# Patient Record
Sex: Female | Born: 1970
Health system: Southern US, Community
[De-identification: ages and names within clinical notes are randomized; demographics above are authoritative.]

## PROBLEM LIST (undated history)

## (undated) DIAGNOSIS — J302 Other seasonal allergic rhinitis: Secondary | ICD-10-CM

## (undated) DIAGNOSIS — D0510 Intraductal carcinoma in situ of unspecified breast: Secondary | ICD-10-CM

## (undated) DIAGNOSIS — E079 Disorder of thyroid, unspecified: Secondary | ICD-10-CM

## (undated) DIAGNOSIS — L723 Sebaceous cyst: Secondary | ICD-10-CM

## (undated) DIAGNOSIS — E039 Hypothyroidism, unspecified: Secondary | ICD-10-CM

## (undated) DIAGNOSIS — N6320 Unspecified lump in the left breast, unspecified quadrant: Secondary | ICD-10-CM

## (undated) DIAGNOSIS — T7840XA Allergy, unspecified, initial encounter: Secondary | ICD-10-CM

## (undated) HISTORY — PX: BREAST BIOPSY: SHX20

## (undated) HISTORY — DX: Allergy, unspecified, initial encounter: T78.40XA

## (undated) HISTORY — PX: WISDOM TOOTH EXTRACTION: SHX21

## (undated) HISTORY — DX: Intraductal carcinoma in situ of unspecified breast: D05.10

## (undated) HISTORY — PX: BREAST EXCISIONAL BIOPSY: SUR124

---

## 1998-03-11 ENCOUNTER — Ambulatory Visit (HOSPITAL_COMMUNITY): Admission: RE | Admit: 1998-03-11 | Discharge: 1998-03-11 | Payer: Self-pay | Admitting: Internal Medicine

## 1998-03-12 ENCOUNTER — Encounter: Payer: Self-pay | Admitting: Internal Medicine

## 2000-07-18 ENCOUNTER — Ambulatory Visit (HOSPITAL_COMMUNITY): Admission: RE | Admit: 2000-07-18 | Discharge: 2000-07-18 | Payer: Self-pay | Admitting: Internal Medicine

## 2000-09-07 ENCOUNTER — Other Ambulatory Visit: Admission: RE | Admit: 2000-09-07 | Discharge: 2000-09-07 | Payer: Self-pay | Admitting: *Deleted

## 2001-10-24 ENCOUNTER — Emergency Department (HOSPITAL_COMMUNITY): Admission: EM | Admit: 2001-10-24 | Discharge: 2001-10-24 | Payer: Self-pay | Admitting: Emergency Medicine

## 2001-10-24 ENCOUNTER — Other Ambulatory Visit: Admission: RE | Admit: 2001-10-24 | Discharge: 2001-10-24 | Payer: Self-pay | Admitting: Obstetrics and Gynecology

## 2002-03-25 ENCOUNTER — Other Ambulatory Visit: Admission: RE | Admit: 2002-03-25 | Discharge: 2002-03-25 | Payer: Self-pay | Admitting: Obstetrics and Gynecology

## 2002-04-04 ENCOUNTER — Emergency Department (HOSPITAL_COMMUNITY): Admission: EM | Admit: 2002-04-04 | Discharge: 2002-04-04 | Payer: Self-pay | Admitting: Emergency Medicine

## 2002-06-17 ENCOUNTER — Other Ambulatory Visit: Admission: RE | Admit: 2002-06-17 | Discharge: 2002-06-17 | Payer: Self-pay | Admitting: Obstetrics and Gynecology

## 2002-10-30 ENCOUNTER — Other Ambulatory Visit: Admission: RE | Admit: 2002-10-30 | Discharge: 2002-10-30 | Payer: Self-pay | Admitting: Obstetrics and Gynecology

## 2003-01-30 ENCOUNTER — Other Ambulatory Visit: Admission: RE | Admit: 2003-01-30 | Discharge: 2003-01-30 | Payer: Self-pay | Admitting: Obstetrics and Gynecology

## 2003-11-03 ENCOUNTER — Other Ambulatory Visit: Admission: RE | Admit: 2003-11-03 | Discharge: 2003-11-03 | Payer: Self-pay | Admitting: Obstetrics and Gynecology

## 2004-07-07 ENCOUNTER — Inpatient Hospital Stay (HOSPITAL_COMMUNITY): Admission: AD | Admit: 2004-07-07 | Discharge: 2004-07-09 | Payer: Self-pay | Admitting: Obstetrics and Gynecology

## 2004-08-18 ENCOUNTER — Other Ambulatory Visit: Admission: RE | Admit: 2004-08-18 | Discharge: 2004-08-18 | Payer: Self-pay | Admitting: Obstetrics and Gynecology

## 2005-08-19 ENCOUNTER — Other Ambulatory Visit: Admission: RE | Admit: 2005-08-19 | Discharge: 2005-08-19 | Payer: Self-pay | Admitting: Obstetrics and Gynecology

## 2007-03-12 ENCOUNTER — Inpatient Hospital Stay (HOSPITAL_COMMUNITY): Admission: RE | Admit: 2007-03-12 | Discharge: 2007-03-14 | Payer: Self-pay | Admitting: Obstetrics and Gynecology

## 2008-06-20 ENCOUNTER — Emergency Department (HOSPITAL_BASED_OUTPATIENT_CLINIC_OR_DEPARTMENT_OTHER): Admission: EM | Admit: 2008-06-20 | Discharge: 2008-06-20 | Payer: Self-pay | Admitting: Emergency Medicine

## 2008-06-27 ENCOUNTER — Emergency Department (HOSPITAL_COMMUNITY): Admission: EM | Admit: 2008-06-27 | Discharge: 2008-06-27 | Payer: Self-pay | Admitting: Family Medicine

## 2010-06-04 ENCOUNTER — Other Ambulatory Visit: Payer: Self-pay | Admitting: Obstetrics and Gynecology

## 2010-06-04 DIAGNOSIS — R928 Other abnormal and inconclusive findings on diagnostic imaging of breast: Secondary | ICD-10-CM

## 2010-06-10 ENCOUNTER — Ambulatory Visit
Admission: RE | Admit: 2010-06-10 | Discharge: 2010-06-10 | Disposition: A | Discharge status: Home / Self Care | Payer: BC Managed Care – PPO | Source: Ambulatory Visit | Attending: Obstetrics and Gynecology | Admitting: Obstetrics and Gynecology

## 2010-06-10 DIAGNOSIS — R928 Other abnormal and inconclusive findings on diagnostic imaging of breast: Secondary | ICD-10-CM

## 2010-10-22 LAB — CBC
HCT: 35.3 — ABNORMAL LOW
Hemoglobin: 12.2
MCHC: 34.7
MCV: 93.9
MCV: 95.1
Platelets: 199
Platelets: 211
RBC: 3.76 — ABNORMAL LOW
RDW: 13.5
WBC: 13.7 — ABNORMAL HIGH
WBC: 18.8 — ABNORMAL HIGH

## 2010-10-22 LAB — CCBB MATERNAL DONOR DRAW

## 2010-10-22 LAB — RPR: RPR Ser Ql: NONREACTIVE

## 2012-05-09 ENCOUNTER — Emergency Department
Admission: EM | Admit: 2012-05-09 | Discharge: 2012-05-09 | Disposition: A | Discharge status: Home / Self Care | Payer: BC Managed Care – PPO | Source: Home / Self Care | Attending: Family Medicine | Admitting: Family Medicine

## 2012-05-09 ENCOUNTER — Encounter: Payer: Self-pay | Admitting: Emergency Medicine

## 2012-05-09 DIAGNOSIS — S139XXA Sprain of joints and ligaments of unspecified parts of neck, initial encounter: Secondary | ICD-10-CM

## 2012-05-09 DIAGNOSIS — S161XXA Strain of muscle, fascia and tendon at neck level, initial encounter: Secondary | ICD-10-CM

## 2012-05-09 HISTORY — DX: Disorder of thyroid, unspecified: E07.9

## 2012-05-09 MED ORDER — MELOXICAM 15 MG PO TABS: 15.0000 mg | ORAL_TABLET | Freq: Every day | ORAL | Status: DC

## 2012-05-09 MED ORDER — KETOROLAC TROMETHAMINE 30 MG/ML IJ SOLN
30.0000 mg | Freq: Once | INTRAMUSCULAR | Status: AC
  Administered 2012-05-09: 30 mg via INTRAMUSCULAR

## 2012-05-09 MED ORDER — CYCLOBENZAPRINE HCL 5 MG PO TABS: 5.0000 mg | ORAL_TABLET | Freq: Three times a day (TID) | ORAL | Status: DC | PRN

## 2012-05-09 NOTE — ED Notes (Signed)
Left shoulder pain while sitting at her desk today, denies trauma, denies workers comp

## 2012-05-09 NOTE — ED Provider Notes (Signed)
History     CSN: [id number]  Arrival date & time 05/09/12  4098   First MD Initiated Contact with Patient 05/09/12 [id number]      Chief Complaint  Patient presents with  . Shoulder Pain   HPI  Neck pain x1 day. Patient was at work when she turned her neck and developed sudden onset of left-sided neck pain. Has had difficulty turning This point. No radicular symptoms. Pain has progressed from the neck into the shoulder. This is never happened before. Patient denies any recent trauma. Past Medical History  Diagnosis Date  . Thyroid disease     History reviewed. No pertinent past surgical history.  No family history on file.  History  Substance Use Topics  . Smoking status: Never Smoker   . Smokeless tobacco: Not on file  . Alcohol Use: No    OB History   Grav Para Term Preterm Abortions TAB SAB Ect Mult Living                  Review of Systems  All other systems reviewed and are negative.    Allergies  Erythromycin  Home Medications   Current Outpatient Rx  Name  Route  Sig  Dispense  Refill  . cetirizine (ZYRTEC) 10 MG tablet   Oral   Take 10 mg by mouth daily.         Marland Kitchen levothyroxine (SYNTHROID, LEVOTHROID) 100 MCG tablet   Oral   Take 100 mcg by mouth daily before breakfast.         . cyclobenzaprine (FLEXERIL) 5 MG tablet   Oral   Take 1 tablet (5 mg total) by mouth 3 (three) times daily as needed for muscle spasms.   30 tablet   0   . meloxicam (MOBIC) 15 MG tablet   Oral   Take 1 tablet (15 mg total) by mouth daily.   30 tablet   1     BP 116/76  Pulse 95  Temp(Src) 98 F (36.7 C) (Oral)  Resp 16  Ht 5\' 6"  (1.676 m)  Wt 139 lb (63.05 kg)  BMI 22.45 kg/m2  SpO2 100%  LMP 05/06/2012  Physical Exam  Constitutional: She appears well-developed and well-nourished.  HENT:  Head: Normocephalic and atraumatic.    Eyes: Conjunctivae are normal. Pupils are equal, round, and reactive to light.  Neck:  Decreased neck range of motion  secondary to pain. Spurling's negative bilaterally.    ED Course  Procedures (including critical care time)  Labs Reviewed - No data to display No results found.   1. Cervical strain, initial encounter       MDM  Toradol 30 mg IM x1. Rice and NSAIDs. Will place on course of Mobic and Flexeril to help with pain. Discussed general care and muscular skeletal red flags. Handout given.  Follow up as needed.      The patient and/or caregiver has been counseled thoroughly with regard to treatment plan and/or medications prescribed including dosage, schedule, interactions, rationale for use, and possible side effects and they verbalize understanding. Diagnoses and expected course of recovery discussed and will return if not improved as expected or if the condition worsens. Patient and/or caregiver verbalized understanding.             Doree Albee, MD 05/09/12 1004

## 2012-07-07 DIAGNOSIS — E039 Hypothyroidism, unspecified: Secondary | ICD-10-CM | POA: Insufficient documentation

## 2012-07-07 DIAGNOSIS — E890 Postprocedural hypothyroidism: Secondary | ICD-10-CM | POA: Insufficient documentation

## 2013-06-17 ENCOUNTER — Other Ambulatory Visit: Payer: Self-pay | Admitting: Obstetrics and Gynecology

## 2013-06-17 DIAGNOSIS — R928 Other abnormal and inconclusive findings on diagnostic imaging of breast: Secondary | ICD-10-CM

## 2013-06-28 ENCOUNTER — Ambulatory Visit
Admission: RE | Admit: 2013-06-28 | Discharge: 2013-06-28 | Disposition: A | Discharge status: Home / Self Care | Payer: 59 | Source: Ambulatory Visit | Attending: Obstetrics and Gynecology | Admitting: Obstetrics and Gynecology

## 2013-06-28 DIAGNOSIS — R928 Other abnormal and inconclusive findings on diagnostic imaging of breast: Secondary | ICD-10-CM

## 2015-03-20 ENCOUNTER — Other Ambulatory Visit: Payer: Self-pay | Admitting: Surgery

## 2016-05-26 ENCOUNTER — Ambulatory Visit (INDEPENDENT_AMBULATORY_CARE_PROVIDER_SITE_OTHER): Payer: PRIVATE HEALTH INSURANCE | Admitting: Osteopathic Medicine

## 2016-05-26 ENCOUNTER — Encounter: Payer: Self-pay | Admitting: Osteopathic Medicine

## 2016-05-26 VITALS — BP 125/78 | HR 94 | Ht 65.0 in | Wt 132.0 lb

## 2016-05-26 DIAGNOSIS — Q159 Congenital malformation of eye, unspecified: Secondary | ICD-10-CM

## 2016-05-26 DIAGNOSIS — Z0000 Encounter for general adult medical examination without abnormal findings: Secondary | ICD-10-CM | POA: Diagnosis not present

## 2016-05-26 DIAGNOSIS — Z23 Encounter for immunization: Secondary | ICD-10-CM | POA: Diagnosis not present

## 2016-05-26 NOTE — Progress Notes (Signed)
HPI: Sheri [name] is a 46 y.o. female  who presents to [place] today, 05/26/16,  for chief complaint of:  Chief Complaint  Patient presents with  . Establish Care    EYE COLOR CONCERN    Very pleasant new patient here to establish care.  Only real concern is I color abdomen mildly: Whitish ring around the outside of the iris. No vision changes. Her eye doctor asked her to inquire regarding high cholesterol. She has no history of cholesterol issues.    Past medical, surgical, social and family history reviewed: There are no active problems to display for this patient.  History reviewed. No pertinent surgical history. Social History  Substance Use Topics  . Smoking status: Never Smoker  . Smokeless tobacco: Never Used  . Alcohol use No   History reviewed. No pertinent family history.   Current medication list and allergy/intolerance information reviewed:   Current Outpatient Prescriptions  Medication Sig Dispense Refill  . cetirizine (ZYRTEC) 10 MG tablet Take 10 mg by mouth daily.    Marland Kitchen levothyroxine (SYNTHROID, LEVOTHROID) 100 MCG tablet Take 100 mcg by mouth daily before breakfast.     No current facility-administered medications for this visit.    Allergies  Allergen Reactions  . Erythromycin       Review of Systems:  Constitutional:  No  fever, no chills, No recent illness, No unintentional weight changes. No significant fatigue.   HEENT: No  headache, no vision change, no hearing change, No sore throat, No  sinus pressure  Cardiac: No  chest pain, No  pressure, No palpitations, No  Orthopnea  Respiratory:  No  shortness of breath. No  Cough  Gastrointestinal: No  abdominal pain, No  nausea, No  vomiting,  No  blood in stool, No  diarrhea, No  constipation   Musculoskeletal: No new myalgia/arthralgia  Genitourinary: No  incontinence, No  abnormal genital bleeding, No abnormal genital discharge  Skin: No  Rash, No  other wounds/concerning lesions  Hem/Onc: No  easy bruising/bleeding, No  abnormal lymph node  Endocrine: No cold intolerance,  No heat intolerance. No polyuria/polydipsia/polyphagia   Neurologic: No  weakness, No  dizziness,  Psychiatric: No  concerns with depression, No  concerns with anxiety, No sleep problems, No mood problems  Exam:  BP 125/78   Pulse 94   Ht 5\' 5"  (1.651 m)   Wt 132 lb (59.9 kg)   BMI 21.97 kg/m   Constitutional: VS see above. General Appearance: alert, well-developed, well-nourished, NAD  Eyes: Normal lids and conjunctive, non-icteric sclera. Opaque than white line bordering outside of the iris.  Ears, Nose, Mouth, Throat: MMM, Normal external inspection ears/nares/mouth/lips/gums. TM normal bilaterally. Pharynx/tonsils no erythema, no exudate. Nasal mucosa normal.   Neck: No masses, trachea midline. No thyroid enlargement. No tenderness/mass appreciated. No lymphadenopathy  Respiratory: Normal respiratory effort. no wheeze, no rhonchi, no rales  Cardiovascular: S1/S2 normal, no murmur, no rub/gallop auscultated. RRR. No lower extremity edema. Pedal pulse II/IV bilaterally DP and PT. No carotid bruit or JVD. No abdominal aortic bruit.  Gastrointestinal: Nontender, no masses. No hepatomegaly, no splenomegaly. No hernia appreciated. Bowel sounds normal. Rectal exam deferred.   Musculoskeletal: Gait normal. No clubbing/cyanosis of digits.   Neurological: Normal balance/coordination. No tremor. No cranial nerve deficit on limited exam. Motor and sensation intact and symmetric. Cerebellar reflexes intact.   Skin: warm, dry, intact. No rash/ulcer. No concerning nevi or subq nodules on limited exam.    Psychiatric:  Normal judgment/insight. Normal mood and affect. Oriented x3.      ASSESSMENT/PLAN:   Annual physical exam - Plan: CBC with Differential/Platelet, COMPLETE METABOLIC PANEL WITH GFR, Lipid panel, VITAMIN D 25 Hydroxy (Vit-D Deficiency,  Fractures)  Eye abnormality - Seems a bit young for arcus senilis but possible limbus sign? Await calcium levels though apparently these can be normal.  Need for tetanus, diphtheria, and acellular pertussis (Tdap) vaccine in patient of adolescent age or older - Plan: Tdap vaccine greater than or equal to 7yo IM   FEMALE PREVENTIVE CARE Updated 05/26/16   ANNUAL SCREENING/COUNSELING  Diet/Exercise - HEALTHY HABITS DISCUSSED TO DECREASE CV RISK History  Smoking Status  . Never Smoker  Smokeless Tobacco  . Never Used   History  Alcohol Use No   No flowsheet data found.  Domestic violence concerns - no  HTN SCREENING - SEE [place]  Sexually active in the past year - Yes with female.  Need/want STI testing today? - no  Concerns about libido or pain with sex? - no  Plans for pregnancy? - husband has vasectomy   INFECTIOUS DISEASE SCREENING  HIV - does not need  GC/CT - does not need  HepC - DOB [birth year] - does not need  TB - does not need  DISEASE SCREENING  Lipid - needs - usually gets through husband's employer   DM2 - needs -   Osteoporosis - women age 63+ - does not need  CANCER SCREENING  Cervical - does not need  Breast - does not need  Lung - does not need  Colon - does not need  ADULT VACCINATION  Influenza - annual vaccine recommended  Td - booster every 10 years - needs booster    Zoster - option at 50, yes at 60+   PCV13 - was not indicated  PPSV23 - was not indicated Immunization History  Administered Date(s) Administered  . Influenza Split 03/20/2012  . Influenza,inj,Quad PF,36+ Mos 10/31/2012  . Td 07/02/2002  . Tdap 05/26/2016     Patient Instructions  Plan: 1. Await lab results and records form your eye doctor for further characterization of the rings in the eye - I don't suspect anything dangerous but let's be sure!  2. Please return to get fasting labs done at your convenience  3. Any questions or  concerns, please let us know! 4. If you don't hear back about your lab results within one week of your blood draw, please call us!     Visit summary with medication list and pertinent instructions was printed for patient to review. All questions at time of visit were answered - patient instructed to contact office with any additional concerns. ER/RTC precautions were reviewed with the patient. Follow-up plan: Return in about 1 year (around 05/26/2017) for [place], sooner if needed / based on lab results.

## 2016-05-26 NOTE — Patient Instructions (Signed)
Plan: 1. Await lab results and records form your eye doctor for further characterization of the rings in the eye - I don't suspect anything dangerous but let's be sure!  2. Please return to get fasting labs done at your convenience  3. Any questions or concerns, please let us know! 4. If you don't hear back about your lab results within one week of your blood draw, please call us!

## 2016-05-27 DIAGNOSIS — Q159 Congenital malformation of eye, unspecified: Secondary | ICD-10-CM | POA: Insufficient documentation

## 2016-05-27 DIAGNOSIS — Z0000 Encounter for general adult medical examination without abnormal findings: Secondary | ICD-10-CM | POA: Insufficient documentation

## 2016-05-27 HISTORY — DX: Congenital malformation of eye, unspecified: Q15.9

## 2016-05-27 LAB — CBC WITH DIFFERENTIAL/PLATELET
BASOS ABS: 0 {cells}/uL (ref 0–200)
Basophils Relative: 0 %
Eosinophils Absolute: 44 cells/uL (ref 15–500)
Eosinophils Relative: 1 %
HEMATOCRIT: 43.5 % (ref 35.0–45.0)
Hemoglobin: 14.8 g/dL (ref 11.7–15.5)
LYMPHS PCT: 36 %
Lymphs Abs: 1584 cells/uL (ref 850–3900)
MCH: 30.1 pg (ref 27.0–33.0)
MCHC: 34 g/dL (ref 32.0–36.0)
MCV: 88.6 fL (ref 80.0–100.0)
MONO ABS: 264 {cells}/uL (ref 200–950)
MONOS PCT: 6 %
MPV: 11.4 fL (ref 7.5–12.5)
Neutro Abs: 2508 cells/uL (ref 1500–7800)
Neutrophils Relative %: 57 %
PLATELETS: 231 10*3/uL (ref 140–400)
RBC: 4.91 MIL/uL (ref 3.80–5.10)
RDW: 13.2 % (ref 11.0–15.0)
WBC: 4.4 10*3/uL (ref 3.8–10.8)

## 2016-05-27 LAB — COMPLETE METABOLIC PANEL WITH GFR
AG RATIO: 1.7 ratio (ref 1.0–2.5)
ALK PHOS: 65 U/L (ref 33–115)
ALT: 24 U/L (ref 6–29)
AST: 25 U/L (ref 10–35)
Albumin: 4.4 g/dL (ref 3.6–5.1)
BILIRUBIN TOTAL: 0.7 mg/dL (ref 0.2–1.2)
BUN/Creatinine Ratio: 14.5 Ratio (ref 6–22)
BUN: 11 mg/dL (ref 7–25)
CALCIUM: 9.2 mg/dL (ref 8.6–10.2)
CO2: 19 mmol/L — AB (ref 20–31)
CREATININE: 0.76 mg/dL (ref 0.50–1.10)
Chloride: 105 mmol/L (ref 98–110)
GFR, Est Non African American: >89 mL/min (ref >=60)
GLOBULIN: 2.6 g/dL (ref 1.9–3.7)
GLUCOSE: 90 mg/dL (ref 65–99)
Potassium: 4 mmol/L (ref 3.5–5.3)
Sodium: 140 mmol/L (ref 135–146)
TOTAL PROTEIN: 7 g/dL (ref 6.1–8.1)

## 2016-05-27 LAB — LIPID PANEL
Cholesterol: 185 mg/dL (ref <200)
HDL: 112 mg/dL (ref >50)
LDL CALC: 61 mg/dL (ref <100)
Total CHOL/HDL Ratio: 1.7 Ratio (ref <5.0)
Triglycerides: 60 mg/dL (ref <150)
VLDL: 12 mg/dL (ref <30)

## 2016-05-28 LAB — VITAMIN D 25 HYDROXY (VIT D DEFICIENCY, FRACTURES): VIT D 25 HYDROXY: 22 ng/mL — AB (ref 30–100)

## 2016-10-10 ENCOUNTER — Ambulatory Visit (INDEPENDENT_AMBULATORY_CARE_PROVIDER_SITE_OTHER): Payer: PRIVATE HEALTH INSURANCE | Admitting: Osteopathic Medicine

## 2016-10-10 ENCOUNTER — Encounter: Payer: Self-pay | Admitting: Osteopathic Medicine

## 2016-10-10 VITALS — BP 110/59 | HR 91 | Ht 65.5 in | Wt 128.0 lb

## 2016-10-10 DIAGNOSIS — R7301 Impaired fasting glucose: Secondary | ICD-10-CM

## 2016-10-10 LAB — POCT GLYCOSYLATED HEMOGLOBIN (HGB A1C): Hemoglobin A1C: 5.4

## 2016-10-10 NOTE — Progress Notes (Signed)
HPI: Sheri [name] is a 46 y.o. female  who presents to [facility] today, 10/10/16,  for chief complaint of:  Chief Complaint  Patient presents with  . Follow-up    discuss lab results    Labs at other provider showed fasting Glc of 105. This is new high for her, DM2 runs in the family, she is concerned she might be affected. A1C 5.4% today   Hypothyroid: meds were adjusted since last I saw her, she is doing well on 88 mcg.    Results for orders placed or performed in visit on 10/10/16 (from the past 24 hour(s))  POCT HgB A1C     Status: None   Collection Time: 10/10/16  1:24 PM  Result Value Ref Range   Hemoglobin A1C 5.4      Past medical, surgical, social and family history reviewed: Patient Active Problem List   Diagnosis Date Noted  . Eye abnormality 05/27/2016  . Annual physical exam 05/27/2016   No past surgical history on file. Social History  Substance Use Topics  . Smoking status: Never Smoker  . Smokeless tobacco: Never Used  . Alcohol use No   Family History  Problem Relation Age of Onset  . Diabetes Mother   . Hypertension Father   . COPD Father      Current medication list and allergy/intolerance information reviewed:   Current Outpatient Prescriptions  Medication Sig Dispense Refill  . cetirizine (ZYRTEC) 10 MG tablet Take 10 mg by mouth daily.    Marland Kitchen levothyroxine (SYNTHROID, LEVOTHROID) 100 MCG tablet Take 100 mcg by mouth daily before breakfast.     No current facility-administered medications for this visit.    Allergies  Allergen Reactions  . Erythromycin   . Shellfish-Derived Products       Review of Systems:  Constitutional:  No  fever, no chills, No recent illness.   Cardiac: No  chest pain  Respiratory:  No  shortness of breath.  Endocrine: No cold intolerance,  No heat intolerance. No polyuria/polydipsia/polyphagia   Exam:  BP (!) 110/59   Pulse 91   Ht 5' 5.5" (1.664 m)   Wt 128 lb  (58.1 kg)   BMI 20.98 kg/m   Constitutional: VS see above. General Appearance: alert, well-developed, well-nourished, NAD  Neck: No masses, trachea midline.   Respiratory: Normal respiratory effort.   Musculoskeletal: Gait normal.  Neurological: Normal balance/coordination. No tremor.   Skin: warm, dry, intact.   Psychiatric: Normal judgment/insight. Normal mood and affect. Oriented x3.     ASSESSMENT/PLAN:   Elevated fasting glucose - No DM2 or Pre-DM, pt reassured - Plan: POCT HgB A1C      Visit summary with medication list and pertinent instructions was printed for patient to review. All questions at time of visit were answered - patient instructed to contact office with any additional concerns. ER/RTC precautions were reviewed with the patient. Follow-up plan: Return for ANNUAL PHYSICAL 05/2016 or see me sooner if needed .  Note: Total time spent 15 minutes, greater than 50% of the visit was spent face-to-face counseling and coordinating care for the following: The encounter diagnosis was Elevated fasting glucose.Marland Kitchen

## 2016-10-10 NOTE — Patient Instructions (Signed)

## 2016-12-27 ENCOUNTER — Encounter: Payer: Self-pay | Admitting: Osteopathic Medicine

## 2016-12-27 ENCOUNTER — Ambulatory Visit (INDEPENDENT_AMBULATORY_CARE_PROVIDER_SITE_OTHER): Payer: PRIVATE HEALTH INSURANCE | Admitting: Osteopathic Medicine

## 2016-12-27 VITALS — BP 127/76 | HR 87 | Temp 98.8°F | Resp 16 | Wt 127.5 lb

## 2016-12-27 DIAGNOSIS — E039 Hypothyroidism, unspecified: Secondary | ICD-10-CM | POA: Diagnosis not present

## 2016-12-27 DIAGNOSIS — R42 Dizziness and giddiness: Secondary | ICD-10-CM

## 2016-12-27 MED ORDER — MECLIZINE HCL 25 MG PO TABS: 25.0000 mg | ORAL_TABLET | Freq: Three times a day (TID) | ORAL | 0 refills | Status: DC | PRN

## 2016-12-27 NOTE — Progress Notes (Signed)
HPI: Sheri [name] is a 46 y.o. female who  has a past medical history of Thyroid disease.  she presents to [facility] today, 12/27/16,  for chief complaint of:  Chief Complaint  Patient presents with  . Dizziness    off balance; dizziness x 1 week  . Nausea    x today    Nausea new today. Off balance/dizziness for about a week. Not sure if inner ear or vertigo. Has had similar episodes in the past which have resolved on their own. She does not note orthostatic symptoms, she does note some symptoms with turning head too quickly.     Past medical, surgical, social and family history reviewed:  Patient Active Problem List   Diagnosis Date Noted  . Eye abnormality 05/27/2016  . Annual physical exam 05/27/2016  . Hypothyroidism 07/07/2012    No past surgical history on file.  Social History   Tobacco Use  . Smoking status: Never Smoker  . Smokeless tobacco: Never Used  Substance Use Topics  . Alcohol use: No    Family History  Problem Relation Age of Onset  . Diabetes Mother   . Hypertension Father   . COPD Father      Current medication list and allergy/intolerance information reviewed:    Current Outpatient Medications  Medication Sig Dispense Refill  . cetirizine (ZYRTEC) 10 MG tablet Take 10 mg by mouth daily.    . Multiple Vitamin (MULTIVITAMIN) tablet Take 1 tablet by mouth daily.    Marland Kitchen SYNTHROID 88 MCG tablet Take 88 mcg by mouth daily.  0   No current facility-administered medications for this visit.     Allergies  Allergen Reactions  . Erythromycin   . Shellfish-Derived Products       Review of Systems:  Constitutional:  No  fever, no chills, No recent illness.   HEENT: No  headache, no vision change, no hearing change, No sore throat, No  sinus pressure  Cardiac: No  chest pain, No  pressure, No palpitations  Respiratory:  No  shortness of breath. No  Cough  Gastrointestinal: No  abdominal pain, No   nausea, No  vomiting,  No  blood in stool, No  diarrhea, No  constipation   Musculoskeletal: No new myalgia/arthralgia  Skin: No  Rash  Neurologic: No  weakness, +dizziness, No  slurred speech/focal weakness/facial droop  Psychiatric: No  concerns with depression, No  concerns with anxiety  Exam:  BP 127/76 (BP Location: Right Arm, Patient Position: Sitting, Cuff Size: Large)   Pulse 87   Temp 98.8 F (37.1 C) (Oral)   Resp 16   Wt 127 lb 8 oz (57.8 kg)   LMP 12/08/2016 (Approximate)   SpO2 99%   BMI 20.89 kg/m   Constitutional: VS see above. General Appearance: alert, well-developed, well-nourished, NAD  Eyes: Normal lids and conjunctive, non-icteric sclera  Ears, Nose, Mouth, Throat: MMM, Normal external inspection ears/nares/mouth/lips/gums. TM normal bilaterally. Pharynx/tonsils no erythema, no exudate. Nasal mucosa normal.   Neck: No masses, trachea midline. No thyroid enlargement.   Respiratory: Normal respiratory effort. no wheeze, no rhonchi, no rales  Cardiovascular: S1/S2 normal, no murmur, no rub/gallop auscultated. RRR. No lower extremity edema.  Gastrointestinal: Nontender, no masses.  Musculoskeletal: Gait normal.  Neurological: Normal balance/coordination. No tremor. No cranial nerve deficit on limited exam. Motor and sensation intact and symmetric. Cerebellar reflexes intact. Dix-Hallpike maneuver reproduces symptoms to a mild degree bilaterally, no nystagmus.  Skin: warm, dry, intact.  No rash/ulcer.   Psychiatric: Normal judgment/insight. Normal mood and affect. Oriented x3.    Results for orders placed or performed in visit on 12/27/16 (from the past 72 hour(s))  CBC     Status: None   Collection Time: 12/27/16  4:15 PM  Result Value Ref Range   WBC 5.8 3.8 - 10.8 Thousand/uL   RBC 4.49 3.80 - 5.10 Million/uL   Hemoglobin 13.7 11.7 - 15.5 g/dL   HCT 39.7 35.0 - 45.0 %   MCV 88.4 80.0 - 100.0 fL   MCH 30.5 27.0 - 33.0 pg   MCHC 34.5 32.0 - 36.0  g/dL   RDW 12.0 11.0 - 15.0 %   Platelets 194 140 - 400 Thousand/uL   MPV 12.0 7.5 - 12.5 fL  COMPLETE METABOLIC PANEL WITH GFR     Status: None   Collection Time: 12/27/16  4:15 PM  Result Value Ref Range   Glucose, Bld 88 65 - 99 mg/dL    Comment: .            Fasting reference interval .    BUN 10 7 - 25 mg/dL   Creat 0.60 0.50 - 1.10 mg/dL   GFR, Est Non African American 110 > OR = 60 mL/min/1.61m2   GFR, Est African American 128 > OR = 60 mL/min/1.35m2   BUN/Creatinine Ratio NOT APPLICABLE 6 - 22 (calc)   Sodium 137 135 - 146 mmol/L   Potassium 3.6 3.5 - 5.3 mmol/L   Chloride 104 98 - 110 mmol/L   CO2 25 20 - 32 mmol/L   Calcium 9.3 8.6 - 10.2 mg/dL   Total Protein 6.8 6.1 - 8.1 g/dL   Albumin 4.3 3.6 - 5.1 g/dL   Globulin 2.5 1.9 - 3.7 g/dL (calc)   AG Ratio 1.7 1.0 - 2.5 (calc)   Total Bilirubin 0.6 0.2 - 1.2 mg/dL   Alkaline phosphatase (APISO) 60 33 - 115 U/L   AST 17 10 - 35 U/L   ALT 15 6 - 29 U/L  TSH     Status: None   Collection Time: 12/27/16  4:15 PM  Result Value Ref Range   TSH 0.80 mIU/L    Comment:           Reference Range .           > or = 20 Years  0.40-4.50 .                Pregnancy Ranges           First trimester    0.26-2.66           Second trimester   0.55-2.73           Third trimester    0.43-2.91     No results found.   ASSESSMENT/PLAN:   Given recurrent nature of symptoms, more likely BPPV, consider imaging/neurology referral if persist or worsen. Patient given printed information for modified Epley maneuvers to you at home and prescription for meclizine to take as needed  Vertigo - Plan: CBC, COMPLETE METABOLIC PANEL WITH GFR, TSH  Hypothyroidism, unspecified type    Patient Instructions  Vertigo Vertigo is the feeling that you or your surroundings are moving when they are not. Vertigo can be dangerous if it occurs while you are doing something that could endanger you or others, such as driving. What are the causes? This  condition is caused by a disturbance in the signals that are sent by your body's  sensory systems to your brain. Different causes of a disturbance can lead to vertigo, including:  Infections, especially in the inner ear.  A bad reaction to a drug, or misuse of alcohol and medicines.  Withdrawal from drugs or alcohol.  Quickly changing positions, as when lying down or rolling over in bed.  Migraine headaches.  Decreased blood flow to the brain.  Decreased blood pressure.  Increased pressure in the brain from a head or neck injury, stroke, infection, tumor, or bleeding.  Central nervous system disorders.  What are the signs or symptoms? Symptoms of this condition usually occur when you move your head or your eyes in different directions. Symptoms may start suddenly, and they usually last for less than a minute. Symptoms may include:  Loss of balance and falling.  Feeling like you are spinning or moving.  Feeling like your surroundings are spinning or moving.  Nausea and vomiting.  Blurred vision or double vision.  Difficulty hearing.  Slurred speech.  Dizziness.  Involuntary eye movement (nystagmus).  Symptoms can be mild and cause only slight annoyance, or they can be severe and interfere with daily life. Episodes of vertigo may return (recur) over time, and they are often triggered by certain movements. Symptoms may improve over time. How is this diagnosed? This condition may be diagnosed based on medical history and the quality of your nystagmus. Your health care provider may test your eye movements by asking you to quickly change positions to trigger the nystagmus. This may be called the Dix-Hallpike test, head thrust test, or roll test. You may be referred to a health care provider who specializes in ear, nose, and throat (ENT) problems (otolaryngologist) or a provider who specializes in disorders of the central nervous system (neurologist). You may have additional  testing, including:  A physical exam.  Blood tests.  MRI.  A CT scan.  An electrocardiogram (ECG). This records electrical activity in your heart.  An electroencephalogram (EEG). This records electrical activity in your brain.  Hearing tests.  How is this treated? Treatment for this condition depends on the cause and the severity of the symptoms. Treatment options include:  Medicines to treat nausea or vertigo. These are usually used for severe cases. Some medicines that are used to treat other conditions may also reduce or eliminate vertigo symptoms. These include: ? Medicines that control allergies (antihistamines). ? Medicines that control seizures (anticonvulsants). ? Medicines that relieve depression (antidepressants). ? Medicines that relieve anxiety (sedatives).  Head movements to adjust your inner ear back to normal. If your vertigo is caused by an ear problem, your health care provider may recommend certain movements to correct the problem.  Surgery. This is rare.  Follow these instructions at home: Safety  Move slowly.Avoid sudden body or head movements.  Avoid driving.  Avoid operating heavy machinery.  Avoid doing any tasks that would cause danger to you or others if you would have a vertigo episode during the task.  If you have trouble walking or keeping your balance, try using a cane for stability. If you feel dizzy or unstable, sit down right away.  Return to your normal activities as told by your health care provider. Ask your health care provider what activities are safe for you. General instructions  Take over-the-counter and prescription medicines only as told by your health care provider.  Avoid certain positions or movements as told by your health care provider.  Drink enough fluid to keep your urine clear or pale yellow.  Keep  all follow-up visits as told by your health care provider. This is important. Contact a health care provider  if:  Your medicines do not relieve your vertigo or they make it worse.  You have a fever.  Your condition gets worse or you develop new symptoms.  Your family or friends notice any behavioral changes.  Your nausea or vomiting gets worse.  You have numbness or a "pins and needles" sensation in part of your body. Get help right away if:  You have difficulty moving or speaking.  You are always dizzy.  You faint.  You develop severe headaches.  You have weakness in your hands, arms, or legs.  You have changes in your hearing or vision.  You develop a stiff neck.  You develop sensitivity to light. This information is not intended to replace advice given to you by your health care provider. Make sure you discuss any questions you have with your health care provider. Document Released: 10/27/2004 Document Revised: 07/01/2015 Document Reviewed: 05/12/2014 Elsevier Interactive Patient Education  2017 [employer].     Visit summary with medication list and pertinent instructions was printed for patient to review. All questions at time of visit were answered - patient instructed to contact office with any additional concerns. ER/RTC precautions were reviewed with the patient. Follow-up plan: Return if symptoms worsen see me ASAP, or let me know if you fail to improve in 1-2 weeks .  Note: Total time spent 25 minutes, greater than 50% of the visit was spent face-to-face counseling and coordinating care for the following: The primary encounter diagnosis was Vertigo. A diagnosis of Hypothyroidism, unspecified type was also pertinent to this visit.Marland Kitchen  Please note: voice recognition software was used to produce this document, and typos may escape review. Please contact Dr. Sheppard Coil for any needed clarifications.

## 2016-12-27 NOTE — Patient Instructions (Signed)
Vertigo Vertigo is the feeling that you or your surroundings are moving when they are not. Vertigo can be dangerous if it occurs while you are doing something that could endanger you or others, such as driving. What are the causes? This condition is caused by a disturbance in the signals that are sent by your body's sensory systems to your brain. Different causes of a disturbance can lead to vertigo, including:  Infections, especially in the inner ear.  A bad reaction to a drug, or misuse of alcohol and medicines.  Withdrawal from drugs or alcohol.  Quickly changing positions, as when lying down or rolling over in bed.  Migraine headaches.  Decreased blood flow to the brain.  Decreased blood pressure.  Increased pressure in the brain from a head or neck injury, stroke, infection, tumor, or bleeding.  Central nervous system disorders.  What are the signs or symptoms? Symptoms of this condition usually occur when you move your head or your eyes in different directions. Symptoms may start suddenly, and they usually last for less than a minute. Symptoms may include:  Loss of balance and falling.  Feeling like you are spinning or moving.  Feeling like your surroundings are spinning or moving.  Nausea and vomiting.  Blurred vision or double vision.  Difficulty hearing.  Slurred speech.  Dizziness.  Involuntary eye movement (nystagmus).  Symptoms can be mild and cause only slight annoyance, or they can be severe and interfere with daily life. Episodes of vertigo may return (recur) over time, and they are often triggered by certain movements. Symptoms may improve over time. How is this diagnosed? This condition may be diagnosed based on medical history and the quality of your nystagmus. Your health care provider may test your eye movements by asking you to quickly change positions to trigger the nystagmus. This may be called the Dix-Hallpike test, head thrust test, or roll test.  You may be referred to a health care provider who specializes in ear, nose, and throat (ENT) problems (otolaryngologist) or a provider who specializes in disorders of the central nervous system (neurologist). You may have additional testing, including:  A physical exam.  Blood tests.  MRI.  A CT scan.  An electrocardiogram (ECG). This records electrical activity in your heart.  An electroencephalogram (EEG). This records electrical activity in your brain.  Hearing tests.  How is this treated? Treatment for this condition depends on the cause and the severity of the symptoms. Treatment options include:  Medicines to treat nausea or vertigo. These are usually used for severe cases. Some medicines that are used to treat other conditions may also reduce or eliminate vertigo symptoms. These include: ? Medicines that control allergies (antihistamines). ? Medicines that control seizures (anticonvulsants). ? Medicines that relieve depression (antidepressants). ? Medicines that relieve anxiety (sedatives).  Head movements to adjust your inner ear back to normal. If your vertigo is caused by an ear problem, your health care provider may recommend certain movements to correct the problem.  Surgery. This is rare.  Follow these instructions at home: Safety  Move slowly.Avoid sudden body or head movements.  Avoid driving.  Avoid operating heavy machinery.  Avoid doing any tasks that would cause danger to you or others if you would have a vertigo episode during the task.  If you have trouble walking or keeping your balance, try using a cane for stability. If you feel dizzy or unstable, sit down right away.  Return to your normal activities as told by your   health care provider. Ask your health care provider what activities are safe for you. General instructions  Take over-the-counter and prescription medicines only as told by your health care provider.  Avoid certain positions or  movements as told by your health care provider.  Drink enough fluid to keep your urine clear or pale yellow.  Keep all follow-up visits as told by your health care provider. This is important. Contact a health care provider if:  Your medicines do not relieve your vertigo or they make it worse.  You have a fever.  Your condition gets worse or you develop new symptoms.  Your family or friends notice any behavioral changes.  Your nausea or vomiting gets worse.  You have numbness or a "pins and needles" sensation in part of your body. Get help right away if:  You have difficulty moving or speaking.  You are always dizzy.  You faint.  You develop severe headaches.  You have weakness in your hands, arms, or legs.  You have changes in your hearing or vision.  You develop a stiff neck.  You develop sensitivity to light. This information is not intended to replace advice given to you by your health care provider. Make sure you discuss any questions you have with your health care provider. Document Released: 10/27/2004 Document Revised: 07/01/2015 Document Reviewed: 05/12/2014 Elsevier Interactive Patient Education  2017 [employer]  

## 2016-12-28 DIAGNOSIS — R42 Dizziness and giddiness: Secondary | ICD-10-CM | POA: Insufficient documentation

## 2016-12-28 LAB — COMPLETE METABOLIC PANEL WITH GFR
AG Ratio: 1.7 (calc) (ref 1.0–2.5)
ALBUMIN MSPROF: 4.3 g/dL (ref 3.6–5.1)
ALT: 15 U/L (ref 6–29)
AST: 17 U/L (ref 10–35)
Alkaline phosphatase (APISO): 60 U/L (ref 33–115)
BUN: 10 mg/dL (ref 7–25)
CALCIUM: 9.3 mg/dL (ref 8.6–10.2)
CO2: 25 mmol/L (ref 20–32)
CREATININE: 0.6 mg/dL (ref 0.50–1.10)
Chloride: 104 mmol/L (ref 98–110)
GFR, EST AFRICAN AMERICAN: 128 mL/min/{1.73_m2} (ref >=60)
GFR, EST NON AFRICAN AMERICAN: 110 mL/min/{1.73_m2} (ref >=60)
Globulin: 2.5 g/dL (calc) (ref 1.9–3.7)
Glucose, Bld: 88 mg/dL (ref 65–99)
Potassium: 3.6 mmol/L (ref 3.5–5.3)
Sodium: 137 mmol/L (ref 135–146)
TOTAL PROTEIN: 6.8 g/dL (ref 6.1–8.1)
Total Bilirubin: 0.6 mg/dL (ref 0.2–1.2)

## 2016-12-28 LAB — CBC
HCT: 39.7 % (ref 35.0–45.0)
HEMOGLOBIN: 13.7 g/dL (ref 11.7–15.5)
MCH: 30.5 pg (ref 27.0–33.0)
MCHC: 34.5 g/dL (ref 32.0–36.0)
MCV: 88.4 fL (ref 80.0–100.0)
MPV: 12 fL (ref 7.5–12.5)
Platelets: 194 10*3/uL (ref 140–400)
RBC: 4.49 10*6/uL (ref 3.80–5.10)
RDW: 12 % (ref 11.0–15.0)
WBC: 5.8 10*3/uL (ref 3.8–10.8)

## 2016-12-28 LAB — TSH: TSH: 0.8 m[IU]/L

## 2017-05-30 ENCOUNTER — Encounter: Payer: Self-pay | Admitting: Osteopathic Medicine

## 2017-05-30 ENCOUNTER — Ambulatory Visit (INDEPENDENT_AMBULATORY_CARE_PROVIDER_SITE_OTHER): Payer: PRIVATE HEALTH INSURANCE | Admitting: Osteopathic Medicine

## 2017-05-30 VITALS — BP 114/69 | HR 88 | Temp 98.2°F | Wt 124.9 lb

## 2017-05-30 DIAGNOSIS — J302 Other seasonal allergic rhinitis: Secondary | ICD-10-CM | POA: Diagnosis not present

## 2017-05-30 DIAGNOSIS — R7301 Impaired fasting glucose: Secondary | ICD-10-CM

## 2017-05-30 DIAGNOSIS — Z0000 Encounter for general adult medical examination without abnormal findings: Secondary | ICD-10-CM

## 2017-05-30 DIAGNOSIS — E039 Hypothyroidism, unspecified: Secondary | ICD-10-CM | POA: Diagnosis not present

## 2017-05-30 LAB — COMPLETE METABOLIC PANEL WITH GFR
AG Ratio: 1.8 (calc) (ref 1.0–2.5)
ALT: 24 U/L (ref 6–29)
AST: 25 U/L (ref 10–35)
Albumin: 4.3 g/dL (ref 3.6–5.1)
Alkaline phosphatase (APISO): 63 U/L (ref 33–115)
BUN: 17 mg/dL (ref 7–25)
CO2: 26 mmol/L (ref 20–32)
CREATININE: 0.66 mg/dL (ref 0.50–1.10)
Calcium: 9.3 mg/dL (ref 8.6–10.2)
Chloride: 107 mmol/L (ref 98–110)
GFR, Est African American: 123 mL/min/{1.73_m2} (ref >=60)
GFR, Est Non African American: 106 mL/min/{1.73_m2} (ref >=60)
Globulin: 2.4 g/dL (calc) (ref 1.9–3.7)
Glucose, Bld: 83 mg/dL (ref 65–99)
POTASSIUM: 4 mmol/L (ref 3.5–5.3)
Sodium: 142 mmol/L (ref 135–146)
Total Bilirubin: 0.5 mg/dL (ref 0.2–1.2)
Total Protein: 6.7 g/dL (ref 6.1–8.1)

## 2017-05-30 LAB — CBC
HCT: 40.4 % (ref 35.0–45.0)
Hemoglobin: 13.8 g/dL (ref 11.7–15.5)
MCH: 30.3 pg (ref 27.0–33.0)
MCHC: 34.2 g/dL (ref 32.0–36.0)
MCV: 88.6 fL (ref 80.0–100.0)
MPV: 11.7 fL (ref 7.5–12.5)
PLATELETS: 201 10*3/uL (ref 140–400)
RBC: 4.56 10*6/uL (ref 3.80–5.10)
RDW: 12.4 % (ref 11.0–15.0)
WBC: 3.7 10*3/uL — AB (ref 3.8–10.8)

## 2017-05-30 LAB — LIPID PANEL
CHOL/HDL RATIO: 2 (calc) (ref <5.0)
Cholesterol: 185 mg/dL (ref <200)
HDL: 92 mg/dL (ref >50)
LDL CHOLESTEROL (CALC): 79 mg/dL
NON-HDL CHOLESTEROL (CALC): 93 mg/dL (ref <130)
TRIGLYCERIDES: 49 mg/dL (ref <150)

## 2017-05-30 LAB — TSH: TSH: 0.39 mIU/L — ABNORMAL LOW

## 2017-05-30 MED ORDER — LEVOCETIRIZINE DIHYDROCHLORIDE 5 MG PO TABS: 5.0000 mg | ORAL_TABLET | Freq: Every evening | ORAL | 3 refills | Status: DC

## 2017-05-30 NOTE — Progress Notes (Signed)
HPI: Sheri [name] is a 47 y.o. female  who presents to [facility] today, 05/30/17,  for chief complaint of:  Annual check-up  Patient here for annual physical / wellness exam.  See preventive care reviewed as below.  Recent labs reviewed in detail with the patient.   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    Past medical, surgical, social and family history reviewed: Patient Active Problem List   Diagnosis Date Noted  . Vertigo 12/28/2016  . Eye abnormality 05/27/2016  . Annual physical exam 05/27/2016  . Hypothyroidism 07/07/2012   No past surgical history on file. Social History   Tobacco Use  . Smoking status: Never Smoker  . Smokeless tobacco: Never Used  Substance Use Topics  . Alcohol use: No   Family History  Problem Relation Age of Onset  . Diabetes Mother   . Hypertension Father   . COPD Father      Current medication list and allergy/intolerance information reviewed:   Current Outpatient Medications  Medication Sig Dispense Refill  . cetirizine (ZYRTEC) 10 MG tablet Take 10 mg by mouth daily.    . meclizine (ANTIVERT) 25 MG tablet Take 1 tablet (25 mg total) by mouth 3 (three) times daily as needed for dizziness or nausea. 30 tablet 0  . Multiple Vitamin (MULTIVITAMIN) tablet Take 1 tablet by mouth daily.    Marland Kitchen SYNTHROID 88 MCG tablet Take 88 mcg by mouth daily.  0   No current facility-administered medications for this visit.    Allergies  Allergen Reactions  . Erythromycin     Other reaction(s): GI UPSET  . Shellfish Allergy   . Shellfish-Derived Products       Review of Systems:  Constitutional:  No  fever, no chills, No recent illness, No unintentional weight changes. No significant fatigue.   HEENT: No   headache, no vision change, no hearing change, No sore throat, No  sinus pressure  Cardiac: No  chest pain, No  pressure, No palpitations, No  Orthopnea  Respiratory:  No  shortness of breath. No  Cough  Gastrointestinal: No  abdominal pain, No  nausea, No  vomiting,  No  blood in stool, No  diarrhea, No  constipation   Musculoskeletal: No new myalgia/arthralgia  Genitourinary: No  incontinence, No  abnormal genital bleeding, No abnormal genital discharge  Skin: No  Rash, No other wounds/concerning lesions  Hem/Onc: No  easy bruising/bleeding, No  abnormal lymph node  Endocrine: No cold intolerance,  No heat intolerance. No polyuria/polydipsia/polyphagia   Neurologic: No  weakness, No  dizziness,  Psychiatric: No  concerns with depression, No  concerns with anxiety, No sleep problems, No mood problems  Exam:  BP 114/69 (BP Location: Left Arm, Patient Position: Sitting, Cuff Size: Normal)   Pulse 88   Temp 98.2 F (36.8 C) (Oral)   Wt 124 lb 14.4 oz (56.7 kg)   LMP 05/22/2017   BMI 20.47 kg/m   Constitutional: VS see above. General Appearance: alert, well-developed, well-nourished, NAD  Eyes: Normal lids and conjunctive, non-icteric sclera. Opaque than white line bordering outside of the iris.  Ears, Nose, Mouth, Throat: MMM, Normal external inspection ears/nares/mouth/lips/gums. TM normal bilaterally. Pharynx/tonsils no erythema, no exudate. Nasal mucosa normal.   Neck: No masses,  trachea midline. No thyroid enlargement. No tenderness/mass appreciated. No lymphadenopathy  Respiratory: Normal respiratory effort. no wheeze, no rhonchi, no rales  Cardiovascular: S1/S2 normal, no murmur, no rub/gallop auscultated. RRR. No lower extremity edema.   Gastrointestinal: Nontender, no masses. No hepatomegaly, no splenomegaly. No hernia appreciated. Bowel sounds normal. Rectal exam deferred.   Musculoskeletal: Gait normal. No clubbing/cyanosis of digits.   Neurological: Normal  balance/coordination. No tremor. No cranial nerve deficit on limited exam. Motor and sensation intact and symmetric. Cerebellar reflexes intact.   Skin: warm, dry, intact. No rash/ulcer. No concerning nevi or subq nodules on limited exam.    Psychiatric: Normal judgment/insight. Normal mood and affect. Oriented x3.      ASSESSMENT/PLAN:   Annual physical exam - Plan: CBC, COMPLETE METABOLIC PANEL WITH GFR, Lipid panel, TSH, Hemoglobin A1c  Hypothyroidism, unspecified type - Plan: TSH  Elevated fasting glucose - Plan: Hemoglobin A1c   FEMALE PREVENTIVE CARE Updated 05/30/17   ANNUAL SCREENING/COUNSELING  Diet/Exercise - HEALTHY HABITS DISCUSSED TO DECREASE CV RISK Social History   Tobacco Use  Smoking Status Never Smoker  Smokeless Tobacco Never Used   Social History   Substance and Sexual Activity  Alcohol Use No   Depression screen PHQ 2/9 05/30/2017  Decreased Interest 0  Down, Depressed, Hopeless 0  PHQ - 2 Score 0  Altered sleeping 0  Tired, decreased energy 0  Change in appetite 0  Feeling bad or failure about yourself  0  Trouble concentrating 0  Moving slowly or fidgety/restless 0  Suicidal thoughts 0  PHQ-9 Score 0  Difficult doing work/chores Not difficult at all    Domestic violence concerns - no  HTN SCREENING - SEE [place]  Sexually active in the past year - Yes with female.  Need/want STI testing today? - no  Concerns about libido or pain with sex? - no  Plans for pregnancy? - husband has vasectomy   INFECTIOUS DISEASE SCREENING  HIV - declined  GC/CT - does not need  HepC - DOB [birth year] - does not need  TB - does not need  DISEASE SCREENING  Lipid - needs - usually gets through husband's employer   DM2 - needs   Osteoporosis - women age 41+ - does not need  CANCER SCREENING  Cervical - does not need - following with OBGYN   Breast - does not need - following with OBGYN  Lung - does not need  Colon -  does not need - no FH  ADULT VACCINATION  Influenza - annual vaccine recommended  Td - booster every 10 years - needs booster    Zoster - option at 62, yes at 60+   PCV13 - was not indicated  PPSV23 - was not indicated Immunization History  Administered Date(s) Administered  . Influenza Split 03/20/2012  . Influenza,inj,Quad PF,6+ Mos 10/31/2012, 11/05/2016  . Td 07/02/2002  . Tdap 05/26/2016      Visit summary with medication list and pertinent instructions was printed for patient to review. All questions at time of visit were answered - patient instructed to contact office with any additional concerns. ER/RTC precautions were reviewed with the patient. Follow-up plan: Return in about 1 year (around 05/31/2018) for annual check up, sooner if needed .

## 2017-05-31 LAB — HEMOGLOBIN A1C
EAG (MMOL/L): 5.4 (calc)
Hgb A1c MFr Bld: 5 % of total Hgb (ref <5.7)
Mean Plasma Glucose: 97 (calc)

## 2017-05-31 MED ORDER — LEVOTHYROXINE SODIUM 75 MCG PO TABS: 75.0000 ug | ORAL_TABLET | Freq: Every day | ORAL | 0 refills | Status: DC

## 2017-05-31 NOTE — Addendum Note (Signed)
Addended by: Maryla Morrow on: 05/31/2017 08:24 AM   Modules accepted: Orders

## 2017-06-03 ENCOUNTER — Encounter: Payer: Self-pay | Admitting: Osteopathic Medicine

## 2017-06-05 ENCOUNTER — Encounter: Payer: Self-pay | Admitting: Osteopathic Medicine

## 2017-06-05 MED ORDER — SYNTHROID 75 MCG PO TABS: 75.0000 ug | ORAL_TABLET | Freq: Every day | ORAL | 0 refills | Status: DC

## 2017-07-24 ENCOUNTER — Other Ambulatory Visit: Payer: Self-pay | Admitting: Osteopathic Medicine

## 2017-07-24 DIAGNOSIS — E039 Hypothyroidism, unspecified: Secondary | ICD-10-CM

## 2017-07-24 NOTE — Telephone Encounter (Addendum)
[facility] requesting med RF for synthroid med. Pt is due for a recheck on thyroid levels before next refill as per provider's last OV note on 05/30/17. Pls pls lab order. Thanks.

## 2017-07-24 NOTE — Telephone Encounter (Signed)
Refill sent Needs labs - orders are in for her to come directly to the lab

## 2017-07-24 NOTE — Telephone Encounter (Signed)
Left VM for Pt with recommendation  

## 2017-07-28 LAB — TSH: TSH: 3.54 mIU/L

## 2017-07-31 ENCOUNTER — Other Ambulatory Visit: Payer: Self-pay | Admitting: Osteopathic Medicine

## 2017-07-31 MED ORDER — SYNTHROID 75 MCG PO TABS: 75.0000 ug | ORAL_TABLET | Freq: Every day | ORAL | 3 refills | Status: DC

## 2017-08-09 ENCOUNTER — Other Ambulatory Visit: Payer: Self-pay | Admitting: Obstetrics and Gynecology

## 2017-08-09 DIAGNOSIS — R928 Other abnormal and inconclusive findings on diagnostic imaging of breast: Secondary | ICD-10-CM

## 2017-08-10 ENCOUNTER — Ambulatory Visit
Admission: RE | Admit: 2017-08-10 | Discharge: 2017-08-10 | Disposition: A | Discharge status: Home / Self Care | Payer: PRIVATE HEALTH INSURANCE | Source: Ambulatory Visit | Attending: Obstetrics and Gynecology | Admitting: Obstetrics and Gynecology

## 2017-08-10 ENCOUNTER — Ambulatory Visit: Payer: PRIVATE HEALTH INSURANCE

## 2017-08-10 ENCOUNTER — Other Ambulatory Visit: Payer: Self-pay | Admitting: Obstetrics and Gynecology

## 2017-08-10 DIAGNOSIS — R928 Other abnormal and inconclusive findings on diagnostic imaging of breast: Secondary | ICD-10-CM

## 2017-08-15 ENCOUNTER — Ambulatory Visit
Admission: RE | Admit: 2017-08-15 | Discharge: 2017-08-15 | Disposition: A | Discharge status: Home / Self Care | Payer: PRIVATE HEALTH INSURANCE | Source: Ambulatory Visit | Attending: Obstetrics and Gynecology | Admitting: Obstetrics and Gynecology

## 2017-08-15 DIAGNOSIS — R928 Other abnormal and inconclusive findings on diagnostic imaging of breast: Secondary | ICD-10-CM

## 2017-08-30 ENCOUNTER — Other Ambulatory Visit: Payer: Self-pay | Admitting: Surgery

## 2017-08-30 DIAGNOSIS — N6092 Unspecified benign mammary dysplasia of left breast: Secondary | ICD-10-CM

## 2017-09-05 ENCOUNTER — Encounter: Payer: Self-pay | Admitting: Osteopathic Medicine

## 2017-09-05 DIAGNOSIS — E039 Hypothyroidism, unspecified: Secondary | ICD-10-CM

## 2017-09-06 LAB — TSH: TSH: 5.61 m[IU]/L — AB

## 2017-09-08 ENCOUNTER — Other Ambulatory Visit: Payer: Self-pay

## 2017-09-08 ENCOUNTER — Encounter (HOSPITAL_BASED_OUTPATIENT_CLINIC_OR_DEPARTMENT_OTHER): Payer: Self-pay | Admitting: *Deleted

## 2017-09-08 ENCOUNTER — Other Ambulatory Visit: Payer: Self-pay | Admitting: Osteopathic Medicine

## 2017-09-08 DIAGNOSIS — E039 Hypothyroidism, unspecified: Secondary | ICD-10-CM

## 2017-09-08 MED ORDER — SYNTHROID 88 MCG PO TABS
88.0000 ug | ORAL_TABLET | Freq: Every day | ORAL | 0 refills | Status: DC
Start: 2017-09-08 — End: 2017-11-23

## 2017-09-08 NOTE — Telephone Encounter (Signed)
Pt has seen results on MyChart and message also sent for patient to call back if any questions.

## 2017-09-13 ENCOUNTER — Ambulatory Visit
Admission: RE | Admit: 2017-09-13 | Discharge: 2017-09-13 | Disposition: A | Discharge status: Home / Self Care | Payer: PRIVATE HEALTH INSURANCE | Source: Ambulatory Visit | Attending: Surgery | Admitting: Surgery

## 2017-09-13 DIAGNOSIS — N6092 Unspecified benign mammary dysplasia of left breast: Secondary | ICD-10-CM

## 2017-09-13 NOTE — Progress Notes (Signed)
Ensure Pre-Surgery drink given with instructions to complete by 0630.  Pt verbalized understanding.

## 2017-09-14 NOTE — H&P (Signed)
Sheri [name] Documented: 08/30/2017 10:24 AM Location: [facility] Patient #: [id number] DOB: 1971/01/31 Married / Language: English / Race: Black or African American Female   History of Present Illness (Sheri Gest A. Ninfa Linden MD; 08/30/2017 10:42 AM) The patient is a 47 year old female who presents with a complaint of Breast problems. This patient is referred by Dr. Dian Queen after recent diagnosis of atypical lobular hyperplasia of the left breast. She had abnormal calcifications seen in the left upper outer quadrant of the left breast. A stereotactic biopsy was performed showing atypical lobular hyperplasia and fibrocystic changes. Excision of this area is recommended. She has a remote family history of breast cancer on her father's side. She has had no previous problems with her breasts. She denies nipple discharge. She also has a symptomatic sebaceous cyst on her right buttock and gluteal cleft which is been infected in the past. She is otherwise healthy and has no other complaints.   Medication History (Armen Ferguson, CMA; 08/30/2017 10:27 AM) Levocetirizine Dihydrochloride (5MG  Tablet, Oral) Active. Synthroid (75MCG Tablet, Oral) Active. Antivert (25MG  Tablet, Oral) Active. Multivitamin Adult (Oral) Active. ZyrTEC (10MG  Tablet, Oral) Active.  Vitals (Armen Ferguson CMA; 08/30/2017 10:25 AM) 08/30/2017 10:24 AM Weight: 129.5 lb Height: 65in Body Surface Area: 1.64 m Body Mass Index: 21.55 kg/m  Temp.: 98.42F  Pulse: 101 (Regular)  P.OX: 99% (Room air) BP: 100/80 (Sitting, Left Arm, Standard)       Physical Exam (Bettylou Frew A. Ninfa Linden MD; 08/30/2017 10:43 AM) General Mental Status-Alert. General Appearance-Consistent with stated age. Hydration-Well hydrated. Voice-Normal.  Head and Neck Head-normocephalic, atraumatic with no lesions or palpable masses. Trachea-midline. Thyroid Gland Characteristics - normal size and  consistency.  Eye Eyeball - Bilateral-Extraocular movements intact. Sclera/Conjunctiva - Bilateral-No scleral icterus.  Chest and Lung Exam Chest and lung exam reveals -quiet, even and easy respiratory effort with no use of accessory muscles and on auscultation, normal breath sounds, no adventitious sounds and normal vocal resonance. Inspection Chest Wall - Normal. Back - normal.  Breast Breast - Left-Symmetric, Non Tender, No Biopsy scars, no Dimpling, No Inflammation, No Lumpectomy scars, No Mastectomy scars, No Peau d' Orange. Breast - Right-Symmetric, Non Tender, No Biopsy scars, no Dimpling, No Inflammation, No Lumpectomy scars, No Mastectomy scars, No Peau d' Orange. Breast Lump-No Palpable Breast Mass. Note: No palpable masses in the breast. The breasts are dense and lumpy bumpy   Cardiovascular Cardiovascular examination reveals -normal heart sounds, regular rate and rhythm with no murmurs and normal pedal pulses bilaterally.  Abdomen Inspection Inspection of the abdomen reveals - No Hernias. Skin - Scar - no surgical scars. Palpation/Percussion Palpation and Percussion of the abdomen reveal - Soft, Non Tender, No Rebound tenderness, No Rigidity (guarding) and No hepatosplenomegaly. Auscultation Auscultation of the abdomen reveals - Bowel sounds normal. Note: She does have the easily palpable sebaceous cyst on the right buttock near the gluteal cleft   Neurologic - Did not examine.  Musculoskeletal - Did not examine.  Lymphatic Head & Neck  General Head & Neck Lymphatics: Bilateral - Description - Normal. Axillary  General Axillary Region: Bilateral - Description - Normal. Tenderness - Non Tender. Femoral & Inguinal - Did not examine.    Assessment & Plan (Miquela Costabile A. Ninfa Linden MD; 08/30/2017 10:44 AM) ATYPICAL LOBULAR HYPERPLASIA (ALH) OF LEFT BREAST (N60.92) Impression: We discussed the diagnosis of atypical lobular hyperplasia of the breast.  Surgical excision of this area is recommended to completely remove the area for histologic evaluation for malignancy. I discussed  the reasoning for this with her in detail and I gave her a copy of the pathology results. A radioactive seed guided left breast lumpectomy is recommended. I discussed the risks which includes but is not limited to bleeding, infection, injury to surrounding structures, the need for further procedures if malignancy is found, cardiopulmonary she is, etc. We would also proceed with excision of the right buttock sebaceous cyst as this has been intermittently infected in the past and removal has been recommended. I discussed the risks of that procedure as well. She understands and surgery will be scheduled INFECTED SEBACEOUS CYST (L72.3)

## 2017-09-15 ENCOUNTER — Other Ambulatory Visit: Payer: Self-pay

## 2017-09-15 ENCOUNTER — Ambulatory Visit (HOSPITAL_BASED_OUTPATIENT_CLINIC_OR_DEPARTMENT_OTHER): Payer: PRIVATE HEALTH INSURANCE | Admitting: Anesthesiology

## 2017-09-15 ENCOUNTER — Ambulatory Visit (HOSPITAL_BASED_OUTPATIENT_CLINIC_OR_DEPARTMENT_OTHER)
Admission: RE | Admit: 2017-09-15 | Discharge: 2017-09-15 | Disposition: A | Discharge status: Home / Self Care | Payer: PRIVATE HEALTH INSURANCE | Source: Ambulatory Visit | Attending: Surgery | Admitting: Surgery

## 2017-09-15 ENCOUNTER — Ambulatory Visit
Admission: RE | Admit: 2017-09-15 | Discharge: 2017-09-15 | Disposition: A | Discharge status: Home / Self Care | Payer: PRIVATE HEALTH INSURANCE | Source: Ambulatory Visit | Attending: Surgery | Admitting: Surgery

## 2017-09-15 ENCOUNTER — Encounter (HOSPITAL_BASED_OUTPATIENT_CLINIC_OR_DEPARTMENT_OTHER)
Admission: RE | Disposition: A | Discharge status: Home / Self Care | Payer: Self-pay | Source: Ambulatory Visit | Attending: Surgery

## 2017-09-15 ENCOUNTER — Encounter (HOSPITAL_BASED_OUTPATIENT_CLINIC_OR_DEPARTMENT_OTHER): Payer: Self-pay | Admitting: Anesthesiology

## 2017-09-15 DIAGNOSIS — Z79899 Other long term (current) drug therapy: Secondary | ICD-10-CM | POA: Diagnosis not present

## 2017-09-15 DIAGNOSIS — E039 Hypothyroidism, unspecified: Secondary | ICD-10-CM | POA: Diagnosis not present

## 2017-09-15 DIAGNOSIS — D0502 Lobular carcinoma in situ of left breast: Secondary | ICD-10-CM | POA: Diagnosis not present

## 2017-09-15 DIAGNOSIS — Z803 Family history of malignant neoplasm of breast: Secondary | ICD-10-CM | POA: Insufficient documentation

## 2017-09-15 DIAGNOSIS — N6489 Other specified disorders of breast: Secondary | ICD-10-CM | POA: Diagnosis present

## 2017-09-15 DIAGNOSIS — L723 Sebaceous cyst: Secondary | ICD-10-CM | POA: Diagnosis not present

## 2017-09-15 DIAGNOSIS — N6092 Unspecified benign mammary dysplasia of left breast: Secondary | ICD-10-CM

## 2017-09-15 DIAGNOSIS — Z79890 Hormone replacement therapy (postmenopausal): Secondary | ICD-10-CM | POA: Insufficient documentation

## 2017-09-15 HISTORY — PX: CYST EXCISION: SHX5701

## 2017-09-15 HISTORY — PX: BREAST LUMPECTOMY WITH RADIOACTIVE SEED LOCALIZATION: SHX6424

## 2017-09-15 HISTORY — PX: BREAST LUMPECTOMY: SHX2

## 2017-09-15 HISTORY — DX: Other seasonal allergic rhinitis: J30.2

## 2017-09-15 HISTORY — DX: Sebaceous cyst: L72.3

## 2017-09-15 HISTORY — DX: Hypothyroidism, unspecified: E03.9

## 2017-09-15 HISTORY — DX: Unspecified lump in the left breast, unspecified quadrant: N63.20

## 2017-09-15 SURGERY — BREAST LUMPECTOMY WITH RADIOACTIVE SEED LOCALIZATION
Anesthesia: General | Site: Buttocks | Laterality: Right

## 2017-09-15 MED ORDER — CELECOXIB 200 MG PO CAPS
200.0000 mg | ORAL_CAPSULE | ORAL | Status: AC
  Administered 2017-09-15: 200 mg via ORAL

## 2017-09-15 MED ORDER — EPHEDRINE 5 MG/ML INJ
INTRAVENOUS | Status: AC
  Filled 2017-09-15: qty 10

## 2017-09-15 MED ORDER — ACETAMINOPHEN 500 MG PO TABS
ORAL_TABLET | ORAL | Status: AC
  Filled 2017-09-15: qty 2

## 2017-09-15 MED ORDER — GABAPENTIN 300 MG PO CAPS
300.0000 mg | ORAL_CAPSULE | ORAL | Status: AC
  Administered 2017-09-15: 300 mg via ORAL

## 2017-09-15 MED ORDER — CELECOXIB 200 MG PO CAPS
ORAL_CAPSULE | ORAL | Status: AC
  Filled 2017-09-15: qty 1

## 2017-09-15 MED ORDER — SCOPOLAMINE 1 MG/3DAYS TD PT72: 1.0000 | MEDICATED_PATCH | Freq: Once | TRANSDERMAL | Status: DC | PRN

## 2017-09-15 MED ORDER — DEXAMETHASONE SODIUM PHOSPHATE 4 MG/ML IJ SOLN
INTRAMUSCULAR | Status: DC | PRN
  Administered 2017-09-15: 10 mg via INTRAVENOUS

## 2017-09-15 MED ORDER — OXYCODONE HCL 5 MG PO TABS: 5.0000 mg | ORAL_TABLET | Freq: Once | ORAL | Status: DC | PRN

## 2017-09-15 MED ORDER — PROPOFOL 10 MG/ML IV BOLUS
INTRAVENOUS | Status: DC | PRN
  Administered 2017-09-15: 160 mg via INTRAVENOUS

## 2017-09-15 MED ORDER — CHLORHEXIDINE GLUCONATE CLOTH 2 % EX PADS: 6.0000 | MEDICATED_PAD | Freq: Once | CUTANEOUS | Status: DC

## 2017-09-15 MED ORDER — LACTATED RINGERS IV SOLN: INTRAVENOUS | Status: DC

## 2017-09-15 MED ORDER — MEPERIDINE HCL 25 MG/ML IJ SOLN
6.2500 mg | INTRAMUSCULAR | Status: DC | PRN
  Administered 2017-09-15: 6.25 mg via INTRAVENOUS

## 2017-09-15 MED ORDER — LACTATED RINGERS IV SOLN
INTRAVENOUS | Status: DC
  Administered 2017-09-15: 10:00:00 via INTRAVENOUS

## 2017-09-15 MED ORDER — MIDAZOLAM HCL 2 MG/2ML IJ SOLN
INTRAMUSCULAR | Status: AC
  Filled 2017-09-15: qty 2

## 2017-09-15 MED ORDER — ACETAMINOPHEN 500 MG PO TABS
1000.0000 mg | ORAL_TABLET | ORAL | Status: AC
  Administered 2017-09-15: 1000 mg via ORAL

## 2017-09-15 MED ORDER — CEFAZOLIN SODIUM-DEXTROSE 2-4 GM/100ML-% IV SOLN
INTRAVENOUS | Status: AC
  Filled 2017-09-15: qty 100

## 2017-09-15 MED ORDER — EPHEDRINE SULFATE 50 MG/ML IJ SOLN
INTRAMUSCULAR | Status: DC | PRN
  Administered 2017-09-15 (×2): 10 mg via INTRAVENOUS

## 2017-09-15 MED ORDER — MEPERIDINE HCL 25 MG/ML IJ SOLN
INTRAMUSCULAR | Status: AC
  Filled 2017-09-15: qty 1

## 2017-09-15 MED ORDER — CEFAZOLIN SODIUM-DEXTROSE 2-4 GM/100ML-% IV SOLN
2.0000 g | INTRAVENOUS | Status: AC
  Administered 2017-09-15: 2 g via INTRAVENOUS

## 2017-09-15 MED ORDER — FENTANYL CITRATE (PF) 100 MCG/2ML IJ SOLN
25.0000 ug | INTRAMUSCULAR | Status: DC | PRN
Start: 2017-09-15 — End: 2017-09-15

## 2017-09-15 MED ORDER — GABAPENTIN 300 MG PO CAPS
ORAL_CAPSULE | ORAL | Status: AC
  Filled 2017-09-15: qty 1

## 2017-09-15 MED ORDER — OXYCODONE HCL 5 MG/5ML PO SOLN: 5.0000 mg | Freq: Once | ORAL | Status: DC | PRN

## 2017-09-15 MED ORDER — KETOROLAC TROMETHAMINE 30 MG/ML IJ SOLN
INTRAMUSCULAR | Status: AC
  Filled 2017-09-15: qty 1

## 2017-09-15 MED ORDER — BUPIVACAINE-EPINEPHRINE (PF) 0.25% -1:200000 IJ SOLN
INTRAMUSCULAR | Status: AC
  Filled 2017-09-15: qty 30

## 2017-09-15 MED ORDER — OXYCODONE HCL 5 MG PO TABS: 5.0000 mg | ORAL_TABLET | Freq: Four times a day (QID) | ORAL | 0 refills | Status: DC | PRN

## 2017-09-15 MED ORDER — LIDOCAINE 2% (20 MG/ML) 5 ML SYRINGE
INTRAMUSCULAR | Status: DC | PRN
  Administered 2017-09-15: 50 mg via INTRAVENOUS

## 2017-09-15 MED ORDER — MIDAZOLAM HCL 2 MG/2ML IJ SOLN
1.0000 mg | INTRAMUSCULAR | Status: DC | PRN
  Administered 2017-09-15: 2 mg via INTRAVENOUS

## 2017-09-15 MED ORDER — FENTANYL CITRATE (PF) 100 MCG/2ML IJ SOLN
INTRAMUSCULAR | Status: AC
  Filled 2017-09-15: qty 2

## 2017-09-15 MED ORDER — ONDANSETRON HCL 4 MG/2ML IJ SOLN
INTRAMUSCULAR | Status: DC | PRN
  Administered 2017-09-15: 4 mg via INTRAVENOUS

## 2017-09-15 MED ORDER — KETOROLAC TROMETHAMINE 30 MG/ML IJ SOLN
INTRAMUSCULAR | Status: DC | PRN
  Administered 2017-09-15: 30 mg via INTRAVENOUS

## 2017-09-15 MED ORDER — BUPIVACAINE-EPINEPHRINE 0.5% -1:200000 IJ SOLN
INTRAMUSCULAR | Status: DC | PRN
  Administered 2017-09-15: 10 mL

## 2017-09-15 MED ORDER — FENTANYL CITRATE (PF) 100 MCG/2ML IJ SOLN
50.0000 ug | INTRAMUSCULAR | Status: DC | PRN
  Administered 2017-09-15 (×2): 50 ug via INTRAVENOUS

## 2017-09-15 MED ORDER — PROMETHAZINE HCL 25 MG/ML IJ SOLN: 6.2500 mg | INTRAMUSCULAR | Status: DC | PRN

## 2017-09-15 SURGICAL SUPPLY — 47 items
ADH SKN CLS APL DERMABOND .7 (GAUZE/BANDAGES/DRESSINGS) ×4
APPLIER CLIP 9.375 MED OPEN (MISCELLANEOUS)
APR CLP MED 9.3 20 MLT OPN (MISCELLANEOUS)
BLADE HEX COATED 2.75 (ELECTRODE) ×4 IMPLANT
BLADE SURG 15 STRL LF DISP TIS (BLADE) ×2 IMPLANT
BLADE SURG 15 STRL SS (BLADE) ×4
CANISTER SUC SOCK COL 7IN (MISCELLANEOUS) IMPLANT
CANISTER SUCT 1200ML W/VALVE (MISCELLANEOUS) IMPLANT
CHLORAPREP W/TINT 26ML (MISCELLANEOUS) ×4 IMPLANT
CLIP APPLIE 9.375 MED OPEN (MISCELLANEOUS) IMPLANT
CLIP VESOCCLUDE SM WIDE 6/CT (CLIP) IMPLANT
COVER BACK TABLE 60X90IN (DRAPES) ×4 IMPLANT
COVER MAYO STAND STRL (DRAPES) ×4 IMPLANT
COVER PROBE W GEL 5X96 (DRAPES) ×4 IMPLANT
DECANTER SPIKE VIAL GLASS SM (MISCELLANEOUS) IMPLANT
DERMABOND ADVANCED (GAUZE/BANDAGES/DRESSINGS) ×4
DERMABOND ADVANCED .7 DNX12 (GAUZE/BANDAGES/DRESSINGS) ×4 IMPLANT
DEVICE DUBIN W/COMP PLATE 8390 (MISCELLANEOUS) ×4 IMPLANT
DRAPE LAPAROSCOPIC ABDOMINAL (DRAPES) ×4 IMPLANT
DRAPE LAPAROTOMY 100X72 PEDS (DRAPES) ×4 IMPLANT
DRAPE UTILITY XL STRL (DRAPES) ×4 IMPLANT
ELECT REM PT RETURN 9FT ADLT (ELECTROSURGICAL) ×4
ELECTRODE REM PT RTRN 9FT ADLT (ELECTROSURGICAL) ×2 IMPLANT
GAUZE SPONGE 4X4 12PLY STRL LF (GAUZE/BANDAGES/DRESSINGS) IMPLANT
GLOVE SURG SIGNA 7.5 PF LTX (GLOVE) ×4 IMPLANT
GOWN STRL REUS W/ TWL LRG LVL3 (GOWN DISPOSABLE) ×2 IMPLANT
GOWN STRL REUS W/ TWL XL LVL3 (GOWN DISPOSABLE) ×2 IMPLANT
GOWN STRL REUS W/TWL LRG LVL3 (GOWN DISPOSABLE) ×4
GOWN STRL REUS W/TWL XL LVL3 (GOWN DISPOSABLE) ×4
KIT MARKER MARGIN INK (KITS) ×4 IMPLANT
NDL HYPO 25X1 1.5 SAFETY (NEEDLE) ×2 IMPLANT
NEEDLE HYPO 25X1 1.5 SAFETY (NEEDLE) ×4 IMPLANT
NS IRRIG 1000ML POUR BTL (IV SOLUTION) IMPLANT
PACK BASIN DAY SURGERY FS (CUSTOM PROCEDURE TRAY) ×4 IMPLANT
PENCIL BUTTON HOLSTER BLD 10FT (ELECTRODE) ×4 IMPLANT
SLEEVE SCD COMPRESS KNEE MED (MISCELLANEOUS) ×4 IMPLANT
SPONGE LAP 4X18 RFD (DISPOSABLE) ×4 IMPLANT
SUT MNCRL AB 4-0 PS2 18 (SUTURE) ×4 IMPLANT
SUT SILK 2 0 SH (SUTURE) IMPLANT
SUT VIC AB 3-0 SH 27 (SUTURE) ×4
SUT VIC AB 3-0 SH 27X BRD (SUTURE) ×2 IMPLANT
SYR CONTROL 10ML LL (SYRINGE) ×4 IMPLANT
TOWEL GREEN STERILE FF (TOWEL DISPOSABLE) ×4 IMPLANT
TOWEL OR NON WOVEN STRL DISP B (DISPOSABLE) ×4 IMPLANT
TUBE CONNECTING 20'X1/4 (TUBING)
TUBE CONNECTING 20X1/4 (TUBING) IMPLANT
YANKAUER SUCT BULB TIP NO VENT (SUCTIONS) IMPLANT

## 2017-09-15 NOTE — Anesthesia Postprocedure Evaluation (Signed)
Anesthesia Post Note  Patient: Sheri [name]  Procedure(s) Performed: BREAST LUMPECTOMY WITH RADIOACTIVE SEED LOCALIZATION (Left Breast) EXCISION OF 1CM RIGHT BUTTOCK SEBACEOUS CYST (Right Buttocks)     Patient location during evaluation: PACU Anesthesia Type: General Level of consciousness: awake and alert Pain management: pain level controlled Vital Signs Assessment: post-procedure vital signs reviewed and stable Respiratory status: spontaneous breathing, nonlabored ventilation, respiratory function stable and patient connected to nasal cannula oxygen Cardiovascular status: blood pressure returned to baseline and stable Postop Assessment: no apparent nausea or vomiting Anesthetic complications: no    Last Vitals:  Vitals:   09/15/17 1300 09/15/17 1315  BP: 127/76 130/79  Pulse: 70 79  Resp: 11 10  Temp:    SpO2: 100% 100%    Last Pain:  Vitals:   09/15/17 1315  TempSrc:   PainSc: Asleep                 Effie Berkshire

## 2017-09-15 NOTE — Interval H&P Note (Signed)
History and Physical Interval Note:no change in H and P  09/15/2017 10:16 AM  Sheri [name]  has presented today for surgery, with the diagnosis of LEFT BREAST ATYPICAL LOBULAR HYPERPLASIA, 1CM RIGHT BUTTOCK SEBACEOUS CYST  The various methods of treatment have been discussed with the patient and family. After consideration of risks, benefits and other options for treatment, the patient has consented to  Procedure(s): BREAST LUMPECTOMY WITH RADIOACTIVE SEED LOCALIZATION (Left) EXCISION OF 1CM RIGHT BUTTOCK SEBACEOUS CYST (Right) as a surgical intervention .  The patient's history has been reviewed, patient examined, no change in status, stable for surgery.  I have reviewed the patient's chart and labs.  Questions were answered to the patient's satisfaction.     Sheri [name] A

## 2017-09-15 NOTE — Anesthesia Preprocedure Evaluation (Addendum)
Anesthesia Evaluation  Patient identified by MRN, date of birth, ID band Patient awake    Reviewed: Allergy & Precautions, NPO status , Patient's Chart, lab work & pertinent test results  Airway Mallampati: II  TM Distance: >3 FB Neck ROM: Full    Dental  (+) Teeth Intact, Dental Advisory Given   Pulmonary neg pulmonary ROS,    breath sounds clear to auscultation       Cardiovascular negative cardio ROS   Rhythm:Regular Rate:Normal     Neuro/Psych negative neurological ROS  negative psych ROS   GI/Hepatic negative GI ROS, Neg liver ROS,   Endo/Other  Hypothyroidism   Renal/GU negative Renal ROS     Musculoskeletal negative musculoskeletal ROS (+)   Abdominal Normal abdominal exam  (+)   Peds  Hematology negative hematology ROS (+)   Anesthesia Other Findings   Reproductive/Obstetrics                            Anesthesia Physical Anesthesia Plan  ASA: II  Anesthesia Plan: General   Post-op Pain Management:    Induction: Intravenous  PONV Risk Score and Plan: 4 or greater and Ondansetron, Dexamethasone, Midazolam and Scopolamine patch - Pre-op  Airway Management Planned: Oral ETT  Additional Equipment: None  Intra-op Plan:   Post-operative Plan: Extubation in OR  Informed Consent: I have reviewed the patients History and Physical, chart, labs and discussed the procedure including the risks, benefits and alternatives for the proposed anesthesia with the patient or authorized representative who has indicated his/her understanding and acceptance.   Dental advisory given  Plan Discussed with: CRNA  Anesthesia Plan Comments:        Anesthesia Quick Evaluation

## 2017-09-15 NOTE — Discharge Instructions (Signed)
[facility] Office Phone Number [phone]  BREAST BIOPSY/ LUMPECTOMY: POST OP INSTRUCTIONS  Always review your discharge instruction sheet given to you by the facility where your surgery was performed.  IF YOU HAVE DISABILITY OR FAMILY LEAVE FORMS, YOU MUST BRING THEM TO THE OFFICE FOR PROCESSING.  DO NOT GIVE THEM TO YOUR DOCTOR.  1. A prescription for pain medication may be given to you upon discharge.  Take your pain medication as prescribed, if needed.  If narcotic pain medicine is not needed, then you may take acetaminophen (Tylenol) or ibuprofen (Advil) as needed. 2. Take your usually prescribed medications unless otherwise directed 3. If you need a refill on your pain medication, please contact your pharmacy.  They will contact our office to request authorization.  Prescriptions will not be filled after 5pm or on week-ends. 4. You should eat very light the first 24 hours after surgery, such as soup, crackers, pudding, etc.  Resume your normal diet the day after surgery. 5. Most patients will experience some swelling and bruising in the breast.  Ice packs and a good support bra will help.  Swelling and bruising can take several days to resolve.  6. It is common to experience some constipation if taking pain medication after surgery.  Increasing fluid intake and taking a stool softener will usually help or prevent this problem from occurring.  A mild laxative (Milk of Magnesia or Miralax) should be taken according to package directions if there are no bowel movements after 48 hours. 7. Unless discharge instructions indicate otherwise, you may remove your bandages 24-48 hours after surgery, and you may shower at that time.  You may have steri-strips (small skin tapes) in place directly over the incision.  These strips should be left on the skin for 7-10 days.  If your surgeon used skin glue on the incision, you may shower in 24 hours.  The glue will flake off over the next 2-3  weeks.  Any sutures or staples will be removed at the office during your follow-up visit. 8. ACTIVITIES:  You may resume regular daily activities (gradually increasing) beginning the next day.  Wearing a good support bra or sports bra minimizes pain and swelling.  You may have sexual intercourse when it is comfortable. a. You may drive when you no longer are taking prescription pain medication, you can comfortably wear a seatbelt, and you can safely maneuver your car and apply brakes. b. RETURN TO WORK:  ______________________________________________________________________________________ 9. You should see your doctor in the office for a follow-up appointment approximately two weeks after your surgery.  Your doctors nurse will typically make your follow-up appointment when she calls you with your pathology report.  Expect your pathology report 2-3 business days after your surgery.  You may call to check if you do not hear from [place] after three days. 10. OTHER INSTRUCTIONS: __OK TO SHOWER STARTING TOMORROW. 11. ICE PACK, TYLENOL, IBUPROFEN ALSO FOR PAIN 12. NO VIGOROUS ACTIVITY FOR ONE WEEK. 18. _____________________________________________________________________________________________ _____________________________________________________________________________________________________________________________________ _____________________________________________________________________________________________________________________________________ _____________________________________________________________________________________________________________________________________  WHEN TO CALL YOUR DOCTOR: 1. Fever over 101.0 2. Nausea and/or vomiting. 3. Extreme swelling or bruising. 4. Continued bleeding from incision. 5. Increased pain, redness, or drainage from the incision.  The clinic staff is available to answer your questions during regular business hours.  Please dont hesitate to call  and ask to speak to one of the nurses for clinical concerns.  If you have a medical emergency, go to the nearest emergency room or call 911.  A  surgeon from [facility] is always on call at the hospital.  For further questions, please visit [email or web address]     Post Anesthesia Home Care Instructions  Activity: Get plenty of rest for the remainder of the day. A responsible individual must stay with you for 24 hours following the procedure.  For the next 24 hours, DO NOT: -Drive a car -Paediatric nurse -Drink alcoholic beverages -Take any medication unless instructed by your physician -Make any legal decisions or sign important papers.  Meals: Start with liquid foods such as gelatin or soup. Progress to regular foods as tolerated. Avoid greasy, spicy, heavy foods. If nausea and/or vomiting occur, drink only clear liquids until the nausea and/or vomiting subsides. Call your physician if vomiting continues.  Special Instructions/Symptoms: Your throat may feel dry or sore from the anesthesia or the breathing tube placed in your throat during surgery. If this causes discomfort, gargle with warm salt water. The discomfort should disappear within 24 hours.  If you had a scopolamine patch placed behind your ear for the management of post- operative nausea and/or vomiting:  1. The medication in the patch is effective for 72 hours, after which it should be removed.  Wrap patch in a tissue and discard in the trash. Wash hands thoroughly with soap and water. 2. You may remove the patch earlier than 72 hours if you experience unpleasant side effects which may include dry mouth, dizziness or visual disturbances. 3. Avoid touching the patch. Wash your hands with soap and water after contact with the patch.

## 2017-09-15 NOTE — Op Note (Signed)
BREAST LUMPECTOMY WITH RADIOACTIVE SEED LOCALIZATION, EXCISION OF 1CM RIGHT BUTTOCK SEBACEOUS CYST  Procedure Note  Sheri [name] 09/15/2017   Pre-op Diagnosis: LEFT BREAST ATYPICAL LOBULAR HYPERPLASIA, 1CM RIGHT BUTTOCK SEBACEOUS CYST     Post-op Diagnosis: same  Procedure(s): LEFT BREAST LUMPECTOMY WITH RADIOACTIVE SEED LOCALIZATION EXCISION OF 1CM RIGHT BUTTOCK SEBACEOUS CYST  Surgeon(s): Coralie Keens, MD  Anesthesia: General  Staff:  Circulator: Eda Paschal, RN Relief Circulator: Faythe Dingwall, RN Scrub Person: Mickie Bail, CST  Estimated Blood Loss: Minimal               Specimens: BREAST SENT TO PATH  Indications: This is [name] 47 year old female who has undergone [name] stereotactic biopsy of abnormal calcifications of the left breast.  The pathology showed atypical lobular hyperplasia.  The decision was made to proceed with [name] radioactive seed guided lumpectomy to remove this area.  She is also had an intermittently infected sebaceous cyst on her right buttock which will be removed at the same time  Procedure: The patient was identified in the holding area as the correct patient.  The neoprobe was used to confirm that the radioactive seed was in the left breast.  She was then taken to the operating room.  She was placed supine on the operating table and general anesthesia was induced.  Her left breast was then prepped and draped in usual sterile fashion.  In the neoprobe, the radioactive seed was identified in the upper outer quadrant of the left breast.  I elected to make [name] lateral incision near the axilla and tunnel toward this.  I anesthetized skin with Marcaine and made an incision with [name] scalpel.  I then dissected into the breast tissue and moved medially toward the radioactive seed with the aid of the neoprobe.  I was then able to identify the radioactive seed and excised the area performing [name] lumpectomy with electrocautery.  Once the specimen was removed, the  margins were marked with marker paint, and an x-ray was performed confirming the radioactive seed and previous marker were in the specimen.  The specimen was then sent the pathology for evaluation.  I achieved hemostasis with the cautery.  I anesthetized the wound further with Marcaine.  I then placed one surgical clip deep into the breast tissue.  This was for marker purposes.  I then closed the deep and subtenons tissue with interrupted 3-0 Vicryl sutures and closed the skin with [name] running 4-0 Monocryl.  Dermabond was then applied.  Next the patient was placed in the left lateral decubitus position.  Her buttocks were then prepped and draped in usual sterile fashion.  I anesthetized the skin over the palpable cyst on the right buttock near the gluteal cleft with Marcaine.  I then made an incision with [name] scalpel and excised the cyst and contents in its entirety.  It was consistent with [name] sebaceous cyst.  I then closed the incision with 4-0 Monocryl and Dermabond.  The patient tolerated the procedure well.  All the counts were correct at the end of the procedure.  The patient was then extubated in the operating room and taken in [name] stable condition to the recovery room.          Sheri [name]   Date: 09/15/2017  Time: 11:49 AM

## 2017-09-15 NOTE — Transfer of Care (Signed)
Immediate Anesthesia Transfer of Care Note  Patient: Sheri [name]  Procedure(s) Performed: BREAST LUMPECTOMY WITH RADIOACTIVE SEED LOCALIZATION (Left Breast) EXCISION OF 1CM RIGHT BUTTOCK SEBACEOUS CYST (Right Buttocks)  Patient Location: PACU  Anesthesia Type:General  Level of Consciousness: awake and sedated  Airway & Oxygen Therapy: Patient Spontanous Breathing and Patient connected to face mask oxygen  Post-op Assessment: Report given to RN and Post -op Vital signs reviewed and stable  Post vital signs: Reviewed and stable  Last Vitals:  Vitals Value Taken Time  BP 101/62 09/15/2017 11:52 AM  Temp    Pulse 77 09/15/2017 11:53 AM  Resp 11 09/15/2017 11:53 AM  SpO2 100 % 09/15/2017 11:53 AM  Vitals shown include unvalidated device data.  Last Pain:  Vitals:   09/15/17 0920  TempSrc: Oral         Complications: No apparent anesthesia complications

## 2017-09-15 NOTE — Anesthesia Procedure Notes (Signed)
Procedure Name: LMA Insertion Performed by: Cash Meadow W, CRNA Pre-anesthesia Checklist: Patient identified, Emergency Drugs available, Suction available and Patient being monitored Patient Re-evaluated:Patient Re-evaluated prior to induction Oxygen Delivery Method: Circle system utilized Preoxygenation: Pre-oxygenation with 100% oxygen Induction Type: IV induction Ventilation: Mask ventilation without difficulty LMA: LMA inserted LMA Size: 4.0 Number of attempts: 1 Placement Confirmation: positive ETCO2 Tube secured with: Tape Dental Injury: Teeth and Oropharynx as per pre-operative assessment        

## 2017-09-18 ENCOUNTER — Encounter (HOSPITAL_BASED_OUTPATIENT_CLINIC_OR_DEPARTMENT_OTHER): Payer: Self-pay | Admitting: Surgery

## 2017-09-21 ENCOUNTER — Telehealth: Payer: Self-pay | Admitting: Hematology

## 2017-09-21 ENCOUNTER — Encounter: Payer: Self-pay | Admitting: Hematology

## 2017-09-21 NOTE — Telephone Encounter (Signed)
New high risk referral received from Dr. Ninfa Linden at [facility]. Pt has been scheduled to see Dr. Burr Medico on 9/9 at 230pm. Pt aware to arrive 30 minutes early. Letter mailed.

## 2017-10-07 DIAGNOSIS — D0502 Lobular carcinoma in situ of left breast: Secondary | ICD-10-CM | POA: Insufficient documentation

## 2017-10-09 ENCOUNTER — Telehealth: Payer: Self-pay | Admitting: Hematology

## 2017-10-09 ENCOUNTER — Inpatient Hospital Stay: Payer: PRIVATE HEALTH INSURANCE | Attending: Hematology | Admitting: Hematology

## 2017-10-09 ENCOUNTER — Encounter: Payer: Self-pay | Admitting: Hematology

## 2017-10-09 VITALS — BP 103/65 | HR 89 | Temp 98.6°F | Resp 18 | Ht 65.5 in | Wt 131.4 lb

## 2017-10-09 DIAGNOSIS — D0502 Lobular carcinoma in situ of left breast: Secondary | ICD-10-CM

## 2017-10-09 DIAGNOSIS — D0512 Intraductal carcinoma in situ of left breast: Secondary | ICD-10-CM | POA: Diagnosis not present

## 2017-10-09 DIAGNOSIS — N6310 Unspecified lump in the right breast, unspecified quadrant: Secondary | ICD-10-CM | POA: Insufficient documentation

## 2017-10-09 MED ORDER — TAMOXIFEN CITRATE 20 MG PO TABS: 20.0000 mg | ORAL_TABLET | Freq: Every day | ORAL | 3 refills | Status: DC

## 2017-10-09 NOTE — Progress Notes (Signed)
[facility]  [phone] [phone]) [phone]  Clinic New Consult Note   Patient Care Team: Emeterio Reeve, DO as PCP - General (Osteopathic Medicine)   Date of Service:  10/09/2017  Referring Physician: Dr. Ninfa Linden   CHIEF COMPLAINTS/PURPOSE OF CONSULTATION:  lobular carcinoma in situ of left breast   HISTORY OF PRESENTING ILLNESS:  Sheri [name] 47 y.o. female is a here because of lobular carcinoma in situ The patient was referred by her breast surgeon Dr. Ninfa Linden. The patient presents to the clinic today alone.   She states that she had a screening mammogram that revealed a 3 mm group of calcifications in the upper outer posterior left breast.  When the biopsy which showed lobular carcinoma in situ.  She denies palpable breast mass, skin change or nipple discharge.  She otherwise feels well.  She was referred to see breast surgeon Dr. Ninfa Linden, and underwent left breast lumpectomy lump that was biopsied. She later had left breast lumpectomy on September 15, 2017.  She comfortably well from surgery.    Socially, she works at [employer].  she is married.  Has a daughter and a son.    GYN HISTORY  Menarchal: 47 yo G2P2: 2 children. 56 yo at first delivery.    CURRENT THERAPY  Tamoxifen started on 10/2017   MEDICAL HISTORY:  Past Medical History:  Diagnosis Date  . Breast mass, left   . Hypothyroidism   . Seasonal allergies   . Sebaceous cyst    right buttock  . Thyroid disease     SURGICAL HISTORY: Past Surgical History:  Procedure Laterality Date  . BREAST LUMPECTOMY WITH RADIOACTIVE SEED LOCALIZATION Left 09/15/2017   Procedure: BREAST LUMPECTOMY WITH RADIOACTIVE SEED LOCALIZATION;  Surgeon: Coralie Keens, MD;  Location: [facility];  Service: General;  Laterality: Left;  . CYST EXCISION Right 09/15/2017   Procedure: EXCISION OF 1CM RIGHT BUTTOCK SEBACEOUS CYST;  Surgeon: Coralie Keens, MD;  Location:  [facility];  Service: General;  Laterality: Right;    SOCIAL HISTORY: Social History   Socioeconomic History  . Marital status: Married    Spouse name: Not on file  . Number of children: Not on file  . Years of education: Not on file  . Highest education level: Not on file  Occupational History  . Not on file  Social Needs  . Financial resource strain: Not on file  . Food insecurity:    Worry: Not on file    Inability: Not on file  . Transportation needs:    Medical: Not on file    Non-medical: Not on file  Tobacco Use  . Smoking status: Never Smoker  . Smokeless tobacco: Never Used  Substance and Sexual Activity  . Alcohol use: No  . Drug use: Never  . Sexual activity: Yes    Birth control/protection: Surgical    Comment: husband has had vasectomy  Lifestyle  . Physical activity:    Days per week: Not on file    Minutes per session: Not on file  . Stress: Not on file  Relationships  . Social connections:    Talks on phone: Not on file    Gets together: Not on file    Attends religious service: Not on file    Active member of club or organization: Not on file    Attends meetings of clubs or organizations: Not on file    Relationship status: Not on file  . Intimate partner  violence:    Fear of current or ex partner: Not on file    Emotionally abused: Not on file    Physically abused: Not on file    Forced sexual activity: Not on file  Other Topics Concern  . Not on file  Social History Narrative  . Not on file    FAMILY HISTORY: Family History  Problem Relation Age of Onset  . Diabetes Mother   . Hypertension Father   . COPD Father   . Cancer Paternal Aunt 71       breast cancer  . Cancer Paternal Aunt 61       breast cancer  . Cancer Cousin 50       breast cancer  . Cancer Cousin        breast cancer  . Cancer Other 35       breast cancer    ALLERGIES:  is allergic to erythromycin; shellfish allergy; and shellfish-derived  products.  MEDICATIONS:  Current Outpatient Medications  Medication Sig Dispense Refill  . fluticasone (FLONASE) 50 MCG/ACT nasal spray Place into both nostrils daily.    Marland Kitchen levocetirizine (XYZAL) 5 MG tablet Take 1 tablet (5 mg total) by mouth every evening. 90 tablet 3  . meclizine (ANTIVERT) 25 MG tablet Take 1 tablet (25 mg total) by mouth 3 (three) times daily as needed for dizziness or nausea. 30 tablet 0  . Multiple Vitamin (MULTIVITAMIN) tablet Take 1 tablet by mouth daily.    Marland Kitchen SYNTHROID 88 MCG tablet Take 1 tablet (88 mcg total) by mouth daily. 90 tablet 0  . tamoxifen (NOLVADEX) 20 MG tablet Take 1 tablet (20 mg total) by mouth daily. 30 tablet 3   No current facility-administered medications for this visit.     REVIEW OF SYSTEMS:   Constitutional: Denies fevers, chills or abnormal night sweats Eyes: Denies blurriness of vision, double vision or watery eyes Ears, nose, mouth, throat, and face: Denies mucositis or sore throat Respiratory: Denies cough, dyspnea or wheezes Cardiovascular: Denies palpitation, chest discomfort or lower extremity swelling Gastrointestinal:  Denies nausea, heartburn or change in bowel habits Skin: Denies abnormal skin rashes Lymphatics: Denies new lymphadenopathy or easy bruising Neurological:Denies numbness, tingling or new weaknesses Behavioral/Psych: Mood is stable, no new changes  All other systems were reviewed with the patient and are negative.  PHYSICAL EXAMINATION: ECOG PERFORMANCE STATUS: 0 - Asymptomatic  Vitals:   10/09/17 1426  BP: 103/65  Pulse: 89  Resp: 18  Temp: 98.6 F (37 C)  SpO2: 100%   Filed Weights   10/09/17 1426  Weight: 131 lb 6.4 oz (59.6 kg)    GENERAL:alert, no distress and comfortable SKIN: skin color, texture, turgor are normal, no rashes or significant lesions EYES: normal, conjunctiva are pink and non-injected, sclera clear OROPHARYNX:no exudate, no erythema and lips, buccal mucosa, and tongue normal    NECK: supple, thyroid normal size, non-tender, without nodularity LYMPH:  no palpable lymphadenopathy in the cervical, axillary or inguinal LUNGS: clear to auscultation and percussion with normal breathing effort HEART: regular rate & rhythm and no murmurs and no lower extremity edema ABDOMEN:abdomen soft, non-tender and normal bowel sounds Musculoskeletal:no cyanosis of digits and no clubbing  BREAST: (+) lumpectomy site healing well with mild tenderness in left breast,  (+) lumpy breast tissue (+) 1.5 x 2.5 cm at 12 o'clock about 2 cm above nipple on the right breast, no palpable axillary lymph nodes. PSYCH: alert & oriented x 3 with fluent speech NEURO: no focal  motor/sensory deficits  LABORATORY DATA:  I have reviewed the data as listed CBC Latest Ref Rng & Units 05/30/2017 12/27/2016 05/26/2016  WBC 3.8 - 10.8 Thousand/uL 3.7(L) 5.8 4.4  Hemoglobin 11.7 - 15.5 g/dL 13.8 13.7 14.8  Hematocrit 35.0 - 45.0 % 40.4 39.7 43.5  Platelets 140 - 400 Thousand/uL 201 194 231    CMP Latest Ref Rng & Units 05/30/2017 12/27/2016 05/26/2016  Glucose 65 - 99 mg/dL 83 88 90  BUN 7 - 25 mg/dL 17 10 11   Creatinine 0.50 - 1.10 mg/dL 0.66 0.60 0.76  Sodium 135 - 146 mmol/L 142 137 140  Potassium 3.5 - 5.3 mmol/L 4.0 3.6 4.0  Chloride 98 - 110 mmol/L 107 104 105  CO2 20 - 32 mmol/L 26 25 19(L)  Calcium 8.6 - 10.2 mg/dL 9.3 9.3 9.2  Total Protein 6.1 - 8.1 g/dL 6.7 6.8 7.0  Total Bilirubin 0.2 - 1.2 mg/dL 0.5 0.6 0.7  Alkaline Phos 33 - 115 U/L - - 65  AST 10 - 35 U/L 25 17 25   ALT 6 - 29 U/L 24 15 24     PATHOLOGY  08/10/2017 Genetics Negative MyRisk Genetic test   Surgery: LEFT BREAST LUMPECTOMY WITH RADIOACTIVE SEED LOCALIZATION by Dr. Ninfa Linden 09/15/17 Diagnosis 09/15/17 Breast, lumpectomy, left - LOBULAR NEOPLASIA (LOBULAR CARCINOMA IN SITU). - HEALING BIOPSY SITE. - SEE COMMENT. Microscopic Comment The surgical resection margin(s) of the specimen were inked and microscopically  evaluated.   Diagnosis 08/15/17 Breast, left, needle core biopsy, upper outer quadrant - LOBULAR NEOPLASIA (ATYPICAL LOBULAR HYPERPLASIA). - FIBROCYSTIC CHANGE AND ADENOSIS WITH CALCIFICATIONS. - PSEUDOANGIOMATOUS STROMAL HYPERPLASIA ([place]). Microscopic Comment Dr. Lyndon Code has reviewed the case. The case was called to [facility] on 08/16/2017.  RADIOGRAPHIC STUDIES: I have personally reviewed the radiological images as listed and agreed with the findings in the report. Mm Breast Surgical Specimen  Result Date: 09/15/2017 CLINICAL DATA:  Specimen mammogram following surgical excision a left breast lesion following radioactive seed localization. EXAM: SPECIMEN RADIOGRAPH OF THE LEFT BREAST COMPARISON:  Previous exam(s). FINDINGS: Status post excision of the left breast. The radioactive seed and biopsy marker clip are present, completely intact, and were marked for pathology. IMPRESSION: Specimen radiograph of the left breast. Electronically Signed   By: Lajean Manes M.D.   On: 09/15/2017 11:20   Mm Lt Radioactive Seed Loc Mammo Guide  Result Date: 09/13/2017 CLINICAL DATA:  Preoperative radioactive seed localization, prior to left breast excisional biopsy. EXAM: ULTRASOUND GUIDED RADIOACTIVE SEED LOCALIZATION OF THE LEFT BREAST COMPARISON:  Previous exam(s). FINDINGS: Patient presents for radioactive seed localization prior to left breast excisional biopsy. I met with the patient and we discussed the procedure of seed localization including benefits and alternatives. We discussed the high likelihood of a successful procedure. We discussed the risks of the procedure including infection, bleeding, tissue injury and further surgery. We discussed the low dose of radioactivity involved in the procedure. Informed, written consent was given. The usual time-out protocol was performed immediately prior to the procedure. Using ultrasound guidance, sterile technique, 1% lidocaine and an I-125  radioactive seed, left breast upper outer quadrant coil shaped marker and adjacent residual calcifications were localized using a lateral approach. The follow-up mammogram images confirm the seed in the expected location and were marked for Dr. Ninfa Linden. Follow-up survey of the patient confirms presence of the radioactive seed. Order number of I-125 seed:  [id number]. Total activity:  9.373 millicurie reference Date: September 08, 2017 The patient tolerated the  procedure well and was released from the [facility]. She was given instructions regarding seed removal. IMPRESSION: Radioactive seed localization left breast. No apparent complications. Electronically Signed   By: Fidela Salisbury M.D.   On: 09/13/2017 15:19    Diagnostic Mammogram Left breast 08/10/17 IMPRESSION:  Indeterminate left breast calcificantion at 19m.   ASSESSMENT & PLAN:  Sheri VIVIANis a 47y.o. female, premenopausal   1. Lobular carcinome in situ of left breast  -Mammogram on 08/10/2017 showed Indeterminate left breast calcifications. -Biopsy on 09/15/2017 revealed lobular carcinoma in situ.  -She had left breast lumpectomy on 09/15/2017 with Dr. BNinfa Linden reviewed to the pathology results with her. -Discussed her that LCIS is a benign breast disease, however it is considered high risk for breast cancer.  It increase her risk of breast cancer by 3-5 fold. -We discussed annual screening mammogram, which will detect early stage breast cancer. She agrees to continue. She is very compliant on screening -We also discussed healthy diet and regular exercise, calcium and vitamin D supplement, to reduce her risk of breast cancer --We further discussed chemoprevention to breast cancer. I discussed the option of tamoxifen since she is premenopausal. It is likely going to reduce the risk of breast cancer by 30-40%, however there is no data of survival benefit so far.  --The potential side effects, which includes but not limited to,  hot flash, skin and vaginal dryness, slightly increased risk of cardiovascular disease and cataract, small risk of thrombosis and endometrial cancer, were discussed with her in great details. Preventive strategies for thrombosis, such as being physically active, using compression stocks, avoid cigarette smoking, etc., were reviewed with her. I also recommend her to follow-up with her gynecologist once a year, and watch for vaginal spotting or bleeding, as a clinically sign of endometrial cancer, etc. She voiced good understanding, and agrees to proceed. I called in to her pharmacy today  -Due to her dense breast tissue, I also recommend her to consider annual screening breast MRI, which is more sensitive than mammogram.  2. Right breast mass -Physical exam today, I palpated a 2.5 cm mass at 12 o'clock position of right breast, I recommend a diagnostic mammogram and ultrasound for further evaluation. -She previously had a cyst at the 9 o'clock position of right breast several years ago.    Plan: -Start Tamoxifen, I called in today  -f/u and labs in 3-4 months -[place] [facility]  . [place] LTD UNI RIGHT INC AXILLA    Standing Status:   Future    Standing Expiration Date:   12/10/2018    Order Specific Question:   Reason for Exam (SYMPTOM  OR DIAGNOSIS REQUIRED)    Answer:   screeing    Order Specific Question:   [facility]    [facility] UNI RIGHT    Standing Status:   Future    Standing Expiration Date:   10/10/2018    Order Specific Question:   Reason for Exam (SYMPTOM  OR DIAGNOSIS REQUIRED)    Answer:   right beast lump at 12:00 position, 2-3 cm from nipple    Order Specific Question:   Is the patient pregnant?    Answer:   No    Order Specific Question:   [facility]    [facility]   All questions were  answered. The patient knows  to call the clinic with any problems, questions or concerns. I spent 40 minutes counseling the patient face to face. The total time spent in the appointment was 50 minutes and more than 50% was on counseling.     Truitt Merle, MD 10/09/2017 5:48 PM  I, Carmon Sails am acting as scribe for Dr. Truitt Merle.  I have reviewed the above documentation for accuracy and completeness, and I agree with the above.

## 2017-10-09 NOTE — Telephone Encounter (Signed)
Scheduled appt per 9/9 los - sent reminder letter in the mail .

## 2017-10-19 ENCOUNTER — Ambulatory Visit
Admission: RE | Admit: 2017-10-19 | Discharge: 2017-10-19 | Disposition: A | Discharge status: Home / Self Care | Payer: PRIVATE HEALTH INSURANCE | Source: Ambulatory Visit | Attending: Hematology | Admitting: Hematology

## 2017-10-19 DIAGNOSIS — N6310 Unspecified lump in the right breast, unspecified quadrant: Secondary | ICD-10-CM

## 2017-10-30 ENCOUNTER — Telehealth: Payer: Self-pay

## 2017-10-30 ENCOUNTER — Other Ambulatory Visit: Payer: Self-pay | Admitting: Hematology

## 2017-10-30 DIAGNOSIS — D0502 Lobular carcinoma in situ of left breast: Secondary | ICD-10-CM

## 2017-10-30 NOTE — Telephone Encounter (Signed)
-----   Message from Truitt Merle, MD sent at 10/30/2017  6:54 AM EDT ----- Please let pt know that her mammogram and [place] were negative, but she has a palpable right breast mass, which I recommend b/l breast MRI, I have ordered it. Thanks   Truitt Merle  10/30/2017

## 2017-10-30 NOTE — Telephone Encounter (Signed)
Spoke with patient per Dr. Burr Medico Mammogram and [place] were negative.  Because of the palpable right breast mass she would like to have a MRI of both breasts done. Explained she should be receiving a call from scheduling with this appointment.  Patient verbalized an understanding.

## 2017-11-17 ENCOUNTER — Encounter: Payer: Self-pay | Admitting: Osteopathic Medicine

## 2017-11-18 ENCOUNTER — Ambulatory Visit
Admission: RE | Admit: 2017-11-18 | Discharge: 2017-11-18 | Disposition: A | Discharge status: Home / Self Care | Payer: PRIVATE HEALTH INSURANCE | Source: Ambulatory Visit | Attending: Hematology | Admitting: Hematology

## 2017-11-18 DIAGNOSIS — D0502 Lobular carcinoma in situ of left breast: Secondary | ICD-10-CM

## 2017-11-18 MED ORDER — GADOBENATE DIMEGLUMINE 529 MG/ML IV SOLN
11.0000 mL | Freq: Once | INTRAVENOUS | Status: AC | PRN
  Administered 2017-11-18: 11 mL via INTRAVENOUS

## 2017-11-21 ENCOUNTER — Telehealth: Payer: Self-pay | Admitting: Hematology

## 2017-11-21 ENCOUNTER — Other Ambulatory Visit: Payer: Self-pay | Admitting: Hematology

## 2017-11-21 DIAGNOSIS — N6310 Unspecified lump in the right breast, unspecified quadrant: Secondary | ICD-10-CM

## 2017-11-21 NOTE — Telephone Encounter (Signed)
I called and left a VM on her cell phone, I explained her recent breast MRI findings, and I recommend a MRI guided right breast biopsy to rule out malignancy. I encouraged her to call me back if she has questions. I also informed the breast center and they will schedule the biopsy.  Truitt Merle  11/21/2017 10:53 AM

## 2017-11-22 ENCOUNTER — Other Ambulatory Visit: Payer: Self-pay

## 2017-11-22 DIAGNOSIS — E039 Hypothyroidism, unspecified: Secondary | ICD-10-CM

## 2017-11-22 LAB — TSH: TSH: 0.93 mIU/L

## 2017-11-23 ENCOUNTER — Other Ambulatory Visit: Payer: Self-pay | Admitting: Osteopathic Medicine

## 2017-11-23 MED ORDER — SYNTHROID 88 MCG PO TABS: 88.0000 ug | ORAL_TABLET | Freq: Every day | ORAL | 3 refills | Status: DC

## 2017-11-30 ENCOUNTER — Telehealth: Payer: Self-pay

## 2017-11-30 NOTE — Telephone Encounter (Signed)
deoppred form off for [employer] to be filled out by Sheri [name] in [facility].

## 2017-12-06 ENCOUNTER — Ambulatory Visit
Admission: RE | Admit: 2017-12-06 | Discharge: 2017-12-06 | Disposition: A | Discharge status: Home / Self Care | Payer: PRIVATE HEALTH INSURANCE | Source: Ambulatory Visit | Attending: Hematology | Admitting: Hematology

## 2017-12-06 DIAGNOSIS — N6310 Unspecified lump in the right breast, unspecified quadrant: Secondary | ICD-10-CM

## 2017-12-06 MED ORDER — GADOBUTROL 1 MMOL/ML IV SOLN
6.0000 mL | Freq: Once | INTRAVENOUS | Status: AC | PRN
  Administered 2017-12-06: 6 mL via INTRAVENOUS

## 2017-12-06 NOTE — Progress Notes (Signed)
FMLA successfully faxed to [employer] at [phone]. Mailed copy to patient address on file. 

## 2018-01-05 NOTE — Progress Notes (Signed)
Sheri [name]   [phone] [phone]) [phone]   Clinic Follow up Note   Patient Care Team: Emeterio Reeve, DO as PCP - General (Osteopathic Medicine) Coralie Keens, MD as Consulting Physician (General Surgery) Truitt Merle, MD as Consulting Physician (Hematology) 01/08/2018  CHIEF COMPLAINT: F/u on lobular carcinoma in situ of left breast   CURRENT THERAPY Tamoxifen started 10/09/2017   INTERVAL HISTORY: Sheri [name] is a 47 y.o. female who is here for follow-up. Right breast biopsy ruled out malignancy. Today, she is here alone. She is doing well. She is tolerating Tamoxifen well, but would like to maintain her weight. She tries to exercise and lose weight. She walks 18K steps a day. She denies hot flashes or bleeding between periods.   Pertinent positives and negatives of review of systems are listed and detailed within the above HPI.  REVIEW OF SYSTEMS:   Constitutional: Denies fevers, chills or abnormal weight loss Eyes: Denies blurriness of vision Ears, nose, mouth, throat, and face: Denies mucositis or sore throat Respiratory: Denies cough, dyspnea or wheezes Cardiovascular: Denies palpitation, chest discomfort or lower extremity swelling Gastrointestinal:  Denies nausea, heartburn or change in bowel habits Skin: Denies abnormal skin rashes Lymphatics: Denies new lymphadenopathy or easy bruising Neurological:Denies numbness, tingling or new weaknesses Behavioral/Psych: Mood is stable, no new changes  All other systems were reviewed with the patient and are negative.  MEDICAL HISTORY:  Past Medical History:  Diagnosis Date  . Breast mass, left   . Hypothyroidism   . Seasonal allergies   . Sebaceous cyst    right buttock  . Thyroid disease     SURGICAL HISTORY: Past Surgical History:  Procedure Laterality Date  . BREAST BIOPSY    . BREAST EXCISIONAL BIOPSY    . BREAST LUMPECTOMY WITH RADIOACTIVE SEED LOCALIZATION Left  09/15/2017   Procedure: BREAST LUMPECTOMY WITH RADIOACTIVE SEED LOCALIZATION;  Surgeon: Coralie Keens, MD;  Location: [facility];  Service: General;  Laterality: Left;  . CYST EXCISION Right 09/15/2017   Procedure: EXCISION OF 1CM RIGHT BUTTOCK SEBACEOUS CYST;  Surgeon: Coralie Keens, MD;  Location: [facility];  Service: General;  Laterality: Right;    I have reviewed the social history and family history with the patient and they are unchanged from previous note.  ALLERGIES:  is allergic to erythromycin; shellfish allergy; and shellfish-derived products.  MEDICATIONS:  Current Outpatient Medications  Medication Sig Dispense Refill  . fluticasone (FLONASE) 50 MCG/ACT nasal spray Place into both nostrils daily.    Marland Kitchen levocetirizine (XYZAL) 5 MG tablet Take 1 tablet (5 mg total) by mouth every evening. 90 tablet 3  . meclizine (ANTIVERT) 25 MG tablet Take 1 tablet (25 mg total) by mouth 3 (three) times daily as needed for dizziness or nausea. 30 tablet 0  . Multiple Vitamin (MULTIVITAMIN) tablet Take 1 tablet by mouth daily.    Marland Kitchen SYNTHROID 88 MCG tablet Take 1 tablet (88 mcg total) by mouth daily. 90 tablet 3  . tamoxifen (NOLVADEX) 20 MG tablet Take 1 tablet (20 mg total) by mouth daily. 30 tablet 3   No current facility-administered medications for this visit.     PHYSICAL EXAMINATION: ECOG PERFORMANCE STATUS: 0 - Asymptomatic  Vitals:   01/08/18 1411  BP: 114/70  Pulse: 98  Resp: 18  Temp: 98.7 F (37.1 C)  SpO2: 100%   Filed Weights   01/08/18 1411  Weight: 133 lb 8 oz (60.6 kg)    GENERAL:alert, no distress  and comfortable SKIN: skin color, texture, turgor are normal, no rashes or significant lesions EYES: normal, Conjunctiva are pink and non-injected, sclera clear OROPHARYNX:no exudate, no erythema and lips, buccal mucosa, and tongue normal  NECK: supple, thyroid normal size, non-tender, without nodularity LYMPH:  no palpable  lymphadenopathy in the cervical, axillary or inguinal LUNGS: clear to auscultation and percussion with normal breathing effort HEART: regular rate & rhythm and no murmurs and no lower extremity edema ABDOMEN:abdomen soft, non-tender and normal bowel sounds Musculoskeletal:no cyanosis of digits and no clubbing  NEURO: alert & oriented x 3 with fluent speech, no focal motor/sensory deficits Breast: lumpy breast tissue (+) scar tissue on left breast at previous surgical site. (+) right breast biopsy site healing well. No new palpable masses or skin or nipple changes.   LABORATORY DATA:  I have reviewed the data as listed CBC Latest Ref Rng & Units 01/08/2018 05/30/2017 12/27/2016  WBC 4.0 - 10.5 K/uL 5.8 3.7(L) 5.8  Hemoglobin 12.0 - 15.0 g/dL 12.7 13.8 13.7  Hematocrit 36.0 - 46.0 % 37.9 40.4 39.7  Platelets 150 - 400 K/uL 221 201 194     CMP Latest Ref Rng & Units 01/08/2018 05/30/2017 12/27/2016  Glucose 70 - 99 mg/dL 102(H) 83 88  BUN 6 - 20 mg/dL 18 17 10   Creatinine 0.44 - 1.00 mg/dL 0.74 0.66 0.60  Sodium 135 - 145 mmol/L 142 142 137  Potassium 3.5 - 5.1 mmol/L 3.7 4.0 3.6  Chloride 98 - 111 mmol/L 108 107 104  CO2 22 - 32 mmol/L 26 26 25   Calcium 8.9 - 10.3 mg/dL 9.0 9.3 9.3  Total Protein 6.5 - 8.1 g/dL 6.7 6.7 6.8  Total Bilirubin 0.3 - 1.2 mg/dL 0.3 0.5 0.6  Alkaline Phos 38 - 126 U/L 76 - -  AST 15 - 41 U/L 36 25 17  ALT 0 - 44 U/L 43 24 15   PATHOLOGY  12/06/2017 Surgical Pathology Diagnosis Breast, right, needle core biopsy, posterior lower outer - FIBROCYSTIC CHANGES WITH CALCIFICATIONS. - PSEUDOANGIOMATOUS STROMAL HYPERPLASIA ([place]). - NO EVIDENCE OF MALIGNANCY. Microscopic Comment Called to the [facility] on 12/07/17. (JDP:kh 12/07/17)al Information Specimen Comment Cores in formalin at approx 8:20 AM; extracted less than 10 min; oval area of mass like enhancement on recent MRI; today, there is enhancement in that region but appears more like  non-mass enhancement Specimen(s) Obtained: Breast, right, needle core biopsy, posterior lower outer Specimen Clinical Information ? FCC or FA R/O malignancy Gross The specimen is received in formalin labeled with the patient's name and right breast lower outer quadrant and consists of multiple cores of tan-yellow fibroadipose tissue ranging from 0.8 x 0.6 x 0.3 cm to 2.6 x 0.5 x 0.2 cm. The specimen is entirely submitted in two cassettes. TIF 8:20 a.m. on 12/06/17. CIT less than 10 minutes. KL:ah 12/06/17)   RADIOGRAPHIC STUDIES: I have personally reviewed the radiological images as listed and agreed with the findings in the report. No results found.   11/20/2017 Breast MRI IMPRESSION: 1. Indeterminate enhancing mass measuring 7 millimeters in the LOWER OUTER QUADRANT of the RIGHT breast warranting tissue diagnosis. 2. LEFT breast is negative for malignancy.  ASSESSMENT & PLAN:  Sheri [name] is a 47 y.o. female with history of  1. Lobular carcinome in situ of left breast  -Diagnosed in 10/2017. Treated with lumpectomy. Currently on Tamoxifen to reduce her future risk of breast cancer. Tolerating well with minimal side effects. -Labs reviewed, CBC and CMP are  WNLs. -She is clinically doing very well, was unremarkable, recent biopsy of the right breast calcification was negative for malignancy.  She is due for bilateral breast MRI in May 2020, and will continue annually due to high risk and very dense breast tissue  -Continue annual mammogram, next due in July 2020 -continue Tamoxifen, plan for 5 years   2. Right breast mass -Biopsy of the 39mm mass in LOQ of right breast  in 12/2017 showed fibrocystic changes with no evidence of malignancy.   Plan  -continue tamoxifen -f/u in 6 months with MRI breast at [facility] a week before   No problem-specific Assessment & Plan notes found for this encounter.   Orders Placed This Encounter  Procedures  . MR BREAST BILATERAL W WO CONTRAST  INC CAD    Standing Status:   Future    Standing Expiration Date:   03/12/2019    Order Specific Question:   If indicated for the ordered procedure, I authorize the administration of contrast media per Radiology protocol    Answer:   Yes    Order Specific Question:   What is the patient's sedation requirement?    Answer:   No Sedation    Order Specific Question:   Does the patient have a pacemaker or implanted devices?    Answer:   No    Order Specific Question:   Radiology Contrast Protocol - do NOT remove file path    Answer:   [email or web address]    Order Specific Question:   Preferred imaging location?    Answer:   [facility] (table limit-550lbs)   All questions were answered. The patient knows to call the clinic with any problems, questions or concerns. No barriers to learning was detected. I spent 15 minutes counseling the patient face to face. The total time spent in the appointment was 20 minutes and more than 50% was on counseling and review of test results  I, Noor Dweik am acting as scribe for Dr. Truitt Merle.  I have reviewed the above documentation for accuracy and completeness, and I agree with the above.     Truitt Merle, MD 01/08/2018

## 2018-01-08 ENCOUNTER — Encounter: Payer: Self-pay | Admitting: Hematology

## 2018-01-08 ENCOUNTER — Inpatient Hospital Stay: Payer: PRIVATE HEALTH INSURANCE

## 2018-01-08 ENCOUNTER — Inpatient Hospital Stay: Payer: PRIVATE HEALTH INSURANCE | Attending: Hematology | Admitting: Hematology

## 2018-01-08 VITALS — BP 114/70 | HR 98 | Temp 98.7°F | Resp 18 | Ht 65.5 in | Wt 133.5 lb

## 2018-01-08 DIAGNOSIS — Z79810 Long term (current) use of selective estrogen receptor modulators (SERMs): Secondary | ICD-10-CM | POA: Diagnosis not present

## 2018-01-08 DIAGNOSIS — D0502 Lobular carcinoma in situ of left breast: Secondary | ICD-10-CM | POA: Diagnosis present

## 2018-01-08 DIAGNOSIS — Z79899 Other long term (current) drug therapy: Secondary | ICD-10-CM | POA: Diagnosis not present

## 2018-01-08 DIAGNOSIS — E039 Hypothyroidism, unspecified: Secondary | ICD-10-CM | POA: Insufficient documentation

## 2018-01-08 LAB — CBC WITH DIFFERENTIAL (CANCER CENTER ONLY)
Abs Immature Granulocytes: 0.01 10*3/uL (ref 0.00–0.07)
BASOS ABS: 0 10*3/uL (ref 0.0–0.1)
Basophils Relative: 0 %
Eosinophils Absolute: 0.1 10*3/uL (ref 0.0–0.5)
Eosinophils Relative: 1 %
HCT: 37.9 % (ref 36.0–46.0)
HEMOGLOBIN: 12.7 g/dL (ref 12.0–15.0)
IMMATURE GRANULOCYTES: 0 %
LYMPHS ABS: 2 10*3/uL (ref 0.7–4.0)
LYMPHS PCT: 35 %
MCH: 30.2 pg (ref 26.0–34.0)
MCHC: 33.5 g/dL (ref 30.0–36.0)
MCV: 90 fL (ref 80.0–100.0)
Monocytes Absolute: 0.4 10*3/uL (ref 0.1–1.0)
Monocytes Relative: 6 %
NEUTROS PCT: 58 %
NRBC: 0 % (ref 0.0–0.2)
Neutro Abs: 3.3 10*3/uL (ref 1.7–7.7)
PLATELETS: 221 10*3/uL (ref 150–400)
RBC: 4.21 MIL/uL (ref 3.87–5.11)
RDW: 12.6 % (ref 11.5–15.5)
WBC Count: 5.8 10*3/uL (ref 4.0–10.5)

## 2018-01-08 LAB — CMP (CANCER CENTER ONLY)
ALT: 43 U/L (ref 0–44)
ANION GAP: 8 (ref 5–15)
AST: 36 U/L (ref 15–41)
Albumin: 3.6 g/dL (ref 3.5–5.0)
Alkaline Phosphatase: 76 U/L (ref 38–126)
BUN: 18 mg/dL (ref 6–20)
CHLORIDE: 108 mmol/L (ref 98–111)
CO2: 26 mmol/L (ref 22–32)
Calcium: 9 mg/dL (ref 8.9–10.3)
Creatinine: 0.74 mg/dL (ref 0.44–1.00)
GFR, Estimated: >60 mL/min (ref >60)
Glucose, Bld: 102 mg/dL — ABNORMAL HIGH (ref 70–99)
Potassium: 3.7 mmol/L (ref 3.5–5.1)
SODIUM: 142 mmol/L (ref 135–145)
Total Bilirubin: 0.3 mg/dL (ref 0.3–1.2)
Total Protein: 6.7 g/dL (ref 6.5–8.1)

## 2018-01-08 MED ORDER — TAMOXIFEN CITRATE 20 MG PO TABS: 20.0000 mg | ORAL_TABLET | Freq: Every day | ORAL | 3 refills | Status: DC

## 2018-01-09 ENCOUNTER — Telehealth: Payer: Self-pay | Admitting: Hematology

## 2018-01-09 NOTE — Telephone Encounter (Signed)
Called patient about her appointments in June 2020. No answer. Printed and mailed calendar.

## 2018-01-12 ENCOUNTER — Encounter: Payer: Self-pay | Admitting: Hematology

## 2018-04-01 ENCOUNTER — Encounter: Payer: Self-pay | Admitting: Hematology

## 2018-04-01 ENCOUNTER — Encounter: Payer: Self-pay | Admitting: Osteopathic Medicine

## 2018-04-01 DIAGNOSIS — E039 Hypothyroidism, unspecified: Secondary | ICD-10-CM

## 2018-05-29 ENCOUNTER — Other Ambulatory Visit: Payer: Self-pay | Admitting: Osteopathic Medicine

## 2018-06-03 ENCOUNTER — Other Ambulatory Visit: Payer: Self-pay | Admitting: Osteopathic Medicine

## 2018-06-21 ENCOUNTER — Encounter: Payer: Self-pay | Admitting: Hematology

## 2018-07-09 ENCOUNTER — Telehealth: Payer: Self-pay | Admitting: Hematology

## 2018-07-09 NOTE — Telephone Encounter (Signed)
Called patient per sch msg to see if she would like to postpone or change 6/12 to virtual visit. Spoke with patient, and she wants to keep appt as is. No changes were made

## 2018-07-11 ENCOUNTER — Other Ambulatory Visit: Payer: Self-pay

## 2018-07-11 ENCOUNTER — Ambulatory Visit
Admission: RE | Admit: 2018-07-11 | Discharge: 2018-07-11 | Disposition: A | Discharge status: Home / Self Care | Payer: PRIVATE HEALTH INSURANCE | Source: Ambulatory Visit | Attending: Hematology | Admitting: Hematology

## 2018-07-11 DIAGNOSIS — D0502 Lobular carcinoma in situ of left breast: Secondary | ICD-10-CM

## 2018-07-11 MED ORDER — GADOBUTROL 1 MMOL/ML IV SOLN
6.0000 mL | Freq: Once | INTRAVENOUS | Status: AC | PRN
  Administered 2018-07-11: 13:00:00 6 mL via INTRAVENOUS

## 2018-07-11 NOTE — Progress Notes (Signed)
[facility]   Telephone:(336) [phone] Fax:(336) [phone]   Clinic Follow up Note   Patient Care Team: Emeterio Reeve, DO as PCP - General (Osteopathic Medicine) Coralie Keens, MD as Consulting Physician (General Surgery) Truitt Merle, MD as Consulting Physician (Hematology)  Date of Service:  07/13/2018  CHIEF COMPLAINT: F/u on lobular carcinoma in situof left breast  SUMMARY OF ONCOLOGIC HISTORY: Oncology History  Lobular carcinoma in situ (LCIS) of left breast  08/15/2017 Initial Biopsy   Diagnosis Breast, left, needle core biopsy, upper outer quadrant - LOBULAR NEOPLASIA (ATYPICAL LOBULAR HYPERPLASIA). - FIBROCYSTIC CHANGE AND ADENOSIS WITH CALCIFICATIONS. - PSEUDOANGIOMATOUS STROMAL HYPERPLASIA ([place]).   09/15/2017 Pathology Results   Diagnosis Breast, lumpectomy, left - LOBULAR NEOPLASIA (LOBULAR CARCINOMA IN SITU). - HEALING BIOPSY SITE. - SEE COMMENT.   10/07/2017 Initial Diagnosis   Lobular carcinoma in situ (LCIS) of left breast   10/10/2018 -  Anti-estrogen oral therapy   Tamoxifen started 10/09/2017      CURRENT THERAPY:  Tamoxifen 20mg  daily started 10/09/2017   INTERVAL HISTORY:  Sheri [name] is here for a follow up of lobular carcinoma in situof left breast. She presents to the clinic alone. She notes she is doing well. She notes she is taking Tamoxifen, she denies any side effects. She notes she drinks plenty of milk. She notes feeling lumpiness in her left breast and nothing palpable in her right breast. She notes having 2 cycles 12 days apart. The second one is heavier. She notes when having a light period she will have a second heavier one in the same month.    REVIEW OF SYSTEMS:   Constitutional: Denies fevers, chills or abnormal weight loss Eyes: Denies blurriness of vision Ears, nose, mouth, throat, and face: Denies mucositis or sore throat Respiratory: Denies cough, dyspnea or wheezes Cardiovascular: Denies palpitation,  chest discomfort or lower extremity swelling Gastrointestinal:  Denies nausea, heartburn or change in bowel habits Skin: Denies abnormal skin rashes Lymphatics: Denies new lymphadenopathy or easy bruising Neurological:Denies numbness, tingling or new weaknesses Behavioral/Psych: Mood is stable, no new changes  All other systems were reviewed with the patient and are negative.  MEDICAL HISTORY:  Past Medical History:  Diagnosis Date  . Breast mass, left   . Hypothyroidism   . Seasonal allergies   . Sebaceous cyst    right buttock  . Thyroid disease     SURGICAL HISTORY: Past Surgical History:  Procedure Laterality Date  . BREAST BIOPSY    . BREAST EXCISIONAL BIOPSY    . BREAST LUMPECTOMY WITH RADIOACTIVE SEED LOCALIZATION Left 09/15/2017   Procedure: BREAST LUMPECTOMY WITH RADIOACTIVE SEED LOCALIZATION;  Surgeon: Coralie Keens, MD;  Location: [facility];  Service: General;  Laterality: Left;  . CYST EXCISION Right 09/15/2017   Procedure: EXCISION OF 1CM RIGHT BUTTOCK SEBACEOUS CYST;  Surgeon: Coralie Keens, MD;  Location: [facility];  Service: General;  Laterality: Right;    I have reviewed the social history and family history with the patient and they are unchanged from previous note.  ALLERGIES:  is allergic to erythromycin; shellfish allergy; and shellfish-derived products.  MEDICATIONS:  Current Outpatient Medications  Medication Sig Dispense Refill  . fluticasone (FLONASE) 50 MCG/ACT nasal spray Place into both nostrils daily.    Marland Kitchen levocetirizine (XYZAL) 5 MG tablet TAKE 1 TABLET BY MOUTH EVERY DAY IN THE EVENING 90 tablet 0  . meclizine (ANTIVERT) 25 MG tablet Take 1 tablet (25 mg total) by mouth 3 (three) times  daily as needed for dizziness or nausea. 30 tablet 0  . Multiple Vitamin (MULTIVITAMIN) tablet Take 1 tablet by mouth daily.    Marland Kitchen SYNTHROID 88 MCG tablet Take 1 tablet (88 mcg total) by mouth daily. 90 tablet 3  . tamoxifen  (NOLVADEX) 20 MG tablet Take 1 tablet (20 mg total) by mouth daily. 90 tablet 3   No current facility-administered medications for this visit.     PHYSICAL EXAMINATION: ECOG PERFORMANCE STATUS: 0 - Asymptomatic  Vitals:   07/13/18 1400  BP: 116/71  Pulse: 99  Temp: 99.4 F (37.4 C)  SpO2: 100%   Filed Weights   07/13/18 1400  Weight: 138 lb 4.8 oz (62.7 kg)    GENERAL:alert, no distress and comfortable SKIN: skin color, texture, turgor are normal, no rashes or significant lesions EYES: normal, Conjunctiva are pink and non-injected, sclera clear  NECK: supple, thyroid normal size, non-tender, without nodularity LYMPH:  no palpable lymphadenopathy in the cervical, axillary  LUNGS: clear to auscultation and percussion with normal breathing effort HEART: regular rate & rhythm and no murmurs and no lower extremity edema ABDOMEN:abdomen soft, non-tender and normal bowel sounds Musculoskeletal:no cyanosis of digits and no clubbing  NEURO: alert & oriented x 3 with fluent speech, no focal motor/sensory deficits BREAST: S/p left lumpectomy: Surgical incision healed well with scar tissue and surgical changes. (+) no palpable mass or adenopathy in either breasts   LABORATORY DATA:  I have reviewed the data as listed CBC Latest Ref Rng & Units 07/13/2018 01/08/2018 05/30/2017  WBC 4.0 - 10.5 K/uL 4.7 5.8 3.7(L)  Hemoglobin 12.0 - 15.0 g/dL 12.0 12.7 13.8  Hematocrit 36.0 - 46.0 % 37.4 37.9 40.4  Platelets 150 - 400 K/uL 200 221 201     CMP Latest Ref Rng & Units 07/13/2018 01/08/2018 05/30/2017  Glucose 70 - 99 mg/dL 122(H) 102(H) 83  BUN 6 - 20 mg/dL 14 18 17   Creatinine 0.44 - 1.00 mg/dL 0.78 0.74 0.66  Sodium 135 - 145 mmol/L 141 142 142  Potassium 3.5 - 5.1 mmol/L 3.5 3.7 4.0  Chloride 98 - 111 mmol/L 109 108 107  CO2 22 - 32 mmol/L 25 26 26   Calcium 8.9 - 10.3 mg/dL 8.7(L) 9.0 9.3  Total Protein 6.5 - 8.1 g/dL 6.5 6.7 6.7  Total Bilirubin 0.3 - 1.2 mg/dL 0.2(L) 0.3 0.5   Alkaline Phos 38 - 126 U/L 61 76 -  AST 15 - 41 U/L 25 36 25  ALT 0 - 44 U/L 20 43 24      RADIOGRAPHIC STUDIES: I have personally reviewed the radiological images as listed and agreed with the findings in the report. No results found.   ASSESSMENT & PLAN:  Sheri [name] is a 48 y.o. female with   1. Lobular carcinoma insituof left breast  -Diagnosed in 10/2017. Treated with left lumpectomy. Currently on Tamoxifen to reduce her future risk of breast cancer. Tolerating well with minimal side effects. -She notes having irregular periods. With a lighter cycle she will have a heavier cycle in that same month. I recommend she discuss with Gyn in 08/2018 visit. There is a small risk of endometrial cancer with Tamoxifen.  -She is clinically doing well. Lab reviewed, her CBC and CMP are within normal limits except BG 122, Ca 8.7. Her physical exam and her 07/2018 breast MRI were unremarkable. There is no clinical concern for recurrence. -I encouraged her to take OTC calcium supplement.  -Continue surveillance. Next Mammogram in 08/2018. Next  MRI in 03/2019, to space out with mammogram.  -Continue Tamoxifen for 5 years  -F/u in 6 months    2. Right breast mass -Biopsy of the 25mm mass in LOQ of right breast  in 12/2017 showed fibrocystic changes with no evidence of malignancy. -07/11/18 MRI shows right breast mass is smaller and histologically benign. No further evaluation needed.  -No palpable mass on exam today (07/13/18)  Plan  -continue tamoxifen -lab and f/u in 6 months   -Mammogram in 08/2018   No problem-specific Assessment & Plan notes found for this encounter.   No orders of the defined types were placed in this encounter.  All questions were answered. The patient knows to call the clinic with any problems, questions or concerns. No barriers to learning was detected. I spent 15 minutes counseling the patient face to face. The total time spent in the appointment was 20 minutes  and more than 50% was on counseling and review of test results     Truitt Merle, MD 07/13/2018   I, Joslyn Devon, am acting as scribe for Truitt Merle, MD.   I have reviewed the above documentation for accuracy and completeness, and I agree with the above.

## 2018-07-13 ENCOUNTER — Other Ambulatory Visit: Payer: Self-pay

## 2018-07-13 ENCOUNTER — Inpatient Hospital Stay: Payer: PRIVATE HEALTH INSURANCE | Attending: Hematology

## 2018-07-13 ENCOUNTER — Inpatient Hospital Stay (HOSPITAL_BASED_OUTPATIENT_CLINIC_OR_DEPARTMENT_OTHER): Payer: PRIVATE HEALTH INSURANCE | Admitting: Hematology

## 2018-07-13 VITALS — BP 116/71 | HR 99 | Temp 99.4°F | Ht 65.5 in | Wt 138.3 lb

## 2018-07-13 DIAGNOSIS — Z170 Estrogen receptor positive status [ER+]: Secondary | ICD-10-CM | POA: Insufficient documentation

## 2018-07-13 DIAGNOSIS — D0502 Lobular carcinoma in situ of left breast: Secondary | ICD-10-CM

## 2018-07-13 DIAGNOSIS — E039 Hypothyroidism, unspecified: Secondary | ICD-10-CM | POA: Diagnosis not present

## 2018-07-13 DIAGNOSIS — Z79810 Long term (current) use of selective estrogen receptor modulators (SERMs): Secondary | ICD-10-CM | POA: Insufficient documentation

## 2018-07-13 DIAGNOSIS — Z79899 Other long term (current) drug therapy: Secondary | ICD-10-CM | POA: Insufficient documentation

## 2018-07-13 LAB — CMP (CANCER CENTER ONLY)
ALT: 20 U/L (ref 0–44)
AST: 25 U/L (ref 15–41)
Albumin: 3.5 g/dL (ref 3.5–5.0)
Alkaline Phosphatase: 61 U/L (ref 38–126)
Anion gap: 7 (ref 5–15)
BUN: 14 mg/dL (ref 6–20)
CO2: 25 mmol/L (ref 22–32)
Calcium: 8.7 mg/dL — ABNORMAL LOW (ref 8.9–10.3)
Chloride: 109 mmol/L (ref 98–111)
Creatinine: 0.78 mg/dL (ref 0.44–1.00)
GFR, Est AFR Am: >60 mL/min (ref >60)
GFR, Estimated: >60 mL/min (ref >60)
Glucose, Bld: 122 mg/dL — ABNORMAL HIGH (ref 70–99)
Potassium: 3.5 mmol/L (ref 3.5–5.1)
Sodium: 141 mmol/L (ref 135–145)
Total Bilirubin: 0.2 mg/dL — ABNORMAL LOW (ref 0.3–1.2)
Total Protein: 6.5 g/dL (ref 6.5–8.1)

## 2018-07-13 LAB — CBC WITH DIFFERENTIAL (CANCER CENTER ONLY)
Abs Immature Granulocytes: 0.01 10*3/uL (ref 0.00–0.07)
Basophils Absolute: 0 10*3/uL (ref 0.0–0.1)
Basophils Relative: 0 %
Eosinophils Absolute: 0.1 10*3/uL (ref 0.0–0.5)
Eosinophils Relative: 2 %
HCT: 37.4 % (ref 36.0–46.0)
Hemoglobin: 12 g/dL (ref 12.0–15.0)
Immature Granulocytes: 0 %
Lymphocytes Relative: 44 %
Lymphs Abs: 2.1 10*3/uL (ref 0.7–4.0)
MCH: 29.1 pg (ref 26.0–34.0)
MCHC: 32.1 g/dL (ref 30.0–36.0)
MCV: 90.6 fL (ref 80.0–100.0)
Monocytes Absolute: 0.3 10*3/uL (ref 0.1–1.0)
Monocytes Relative: 7 %
Neutro Abs: 2.2 10*3/uL (ref 1.7–7.7)
Neutrophils Relative %: 47 %
Platelet Count: 200 10*3/uL (ref 150–400)
RBC: 4.13 MIL/uL (ref 3.87–5.11)
RDW: 13 % (ref 11.5–15.5)
WBC Count: 4.7 10*3/uL (ref 4.0–10.5)
nRBC: 0 % (ref 0.0–0.2)

## 2018-07-14 ENCOUNTER — Encounter: Payer: Self-pay | Admitting: Hematology

## 2018-07-16 ENCOUNTER — Telehealth: Payer: Self-pay | Admitting: Hematology

## 2018-07-16 NOTE — Telephone Encounter (Signed)
Scheduled appt per 6/12 los.  Mailed calendar.

## 2018-08-29 ENCOUNTER — Other Ambulatory Visit: Payer: Self-pay | Admitting: Osteopathic Medicine

## 2018-11-08 ENCOUNTER — Telehealth: Payer: Self-pay | Admitting: Osteopathic Medicine

## 2018-11-08 NOTE — Telephone Encounter (Signed)
Pt aware.     Thank you

## 2018-11-08 NOTE — Telephone Encounter (Signed)
Pt called. She has scheduled a virtual appointment on October 26th for med refill and thyroid level check. She wants lab order sent down for thyroid prior to her visit.  Thanks.

## 2018-11-08 NOTE — Telephone Encounter (Signed)
Pls contact pt and inform her that labs for thyroid check was ordered on 04/05/18. She can have it done 1 week before her appt with provider. Thanks.

## 2018-11-22 ENCOUNTER — Other Ambulatory Visit: Payer: Self-pay | Admitting: Osteopathic Medicine

## 2018-11-22 LAB — TSH: TSH: 0.52 mIU/L

## 2018-11-22 MED ORDER — SYNTHROID 88 MCG PO TABS: 88.0000 ug | ORAL_TABLET | Freq: Every day | ORAL | 3 refills | Status: DC

## 2018-11-26 ENCOUNTER — Telehealth: Payer: PRIVATE HEALTH INSURANCE | Admitting: Osteopathic Medicine

## 2018-11-26 ENCOUNTER — Other Ambulatory Visit: Payer: Self-pay

## 2018-12-05 ENCOUNTER — Other Ambulatory Visit: Payer: Self-pay | Admitting: Osteopathic Medicine

## 2018-12-30 ENCOUNTER — Other Ambulatory Visit: Payer: Self-pay | Admitting: Osteopathic Medicine

## 2019-01-10 ENCOUNTER — Telehealth: Payer: Self-pay | Admitting: Nurse Practitioner

## 2019-01-10 NOTE — Telephone Encounter (Signed)
Called patient per providers request, patient agreed to do virtual visit. Labs cancelled.

## 2019-01-13 ENCOUNTER — Other Ambulatory Visit: Payer: Self-pay | Admitting: Osteopathic Medicine

## 2019-01-14 NOTE — Telephone Encounter (Signed)
Requested medication (s) are due for refill today: no  Requested medication (s) are on the active medication list yes  Last refill:  12/30/2018  Future visit scheduled: no  Notes to clinic:  review for refill   Requested Prescriptions  Pending Prescriptions Disp Refills   levocetirizine (XYZAL) 5 MG tablet [Pharmacy Med Name: LEVOCETIRIZINE 5 MG TABLET] 90 tablet 1    Sig: TAKE 1 TABLET (5 MG TOTAL) BY MOUTH EVERY EVENING. MUST MAKE APPOINTMENT      Ear, Nose, and Throat:  Antihistamines Failed - 01/13/2019  2:39 PM      Failed - Valid encounter within last 12 months    Recent Outpatient Visits           1 year ago Annual physical exam   [facility], Lanelle Bal, DO   2 years ago [facility], Lanelle Bal, DO   2 years ago Elevated fasting glucose   [facility], Lanelle Bal, DO   2 years ago Annual physical exam   [facility], Lanelle Bal, DO       Future Appointments             Tomorrow Alla Feeling, NP [facility]

## 2019-01-15 ENCOUNTER — Inpatient Hospital Stay: Payer: PRIVATE HEALTH INSURANCE | Attending: Nurse Practitioner | Admitting: Nurse Practitioner

## 2019-01-15 ENCOUNTER — Encounter: Payer: Self-pay | Admitting: Nurse Practitioner

## 2019-01-15 ENCOUNTER — Other Ambulatory Visit: Payer: PRIVATE HEALTH INSURANCE

## 2019-01-15 DIAGNOSIS — D0502 Lobular carcinoma in situ of left breast: Secondary | ICD-10-CM | POA: Diagnosis not present

## 2019-01-15 NOTE — Progress Notes (Signed)
[place]   Telephone:(336) [phone] Fax:(336) [phone]   Clinic Follow up Note   Patient Care Team: Emeterio Reeve, DO as PCP - General (Osteopathic Medicine) Coralie Keens, MD as Consulting Physician (General Surgery) Truitt Merle, MD as Consulting Physician (Hematology) 01/15/2019   I connected with Sheri [name] on 01/15/19 at 11:45 AM EST by video enabled telemedicine visit and verified that I am speaking with the correct person using two identifiers.   I discussed the limitations, risks, security and privacy concerns of performing an evaluation and management service by telemedicine and the availability of in-person appointments. I also discussed with the patient that there may be a patient responsible charge related to this service. The patient expressed understanding and agreed to proceed.   Other persons participating in the visit and their role in the encounter: None (spouse is at home with patient but did not participate)  Patient's location: Home Provider's location: Home office  CHIEF COMPLAINT: f/u LCIS   SUMMARY OF ONCOLOGIC HISTORY: Oncology History  Lobular carcinoma in situ (LCIS) of left breast  08/15/2017 Initial Biopsy   Diagnosis Breast, left, needle core biopsy, upper outer quadrant - LOBULAR NEOPLASIA (ATYPICAL LOBULAR HYPERPLASIA). - FIBROCYSTIC CHANGE AND ADENOSIS WITH CALCIFICATIONS. - PSEUDOANGIOMATOUS STROMAL HYPERPLASIA ([place]).   09/15/2017 Pathology Results   Diagnosis Breast, lumpectomy, left - LOBULAR NEOPLASIA (LOBULAR CARCINOMA IN SITU). - HEALING BIOPSY SITE. - SEE COMMENT.   10/07/2017 Initial Diagnosis   Lobular carcinoma in situ (LCIS) of left breast   10/10/2018 -  Anti-estrogen oral therapy   Tamoxifen started 10/09/2017     CURRENT THERAPY: Surveillance, tamoxifen 20 mg daily starting 10/2017   INTERVAL HISTORY: Sheri [name] presents via [place] video, she is able to identify herself using 2 patient  identifiers. She feels well. Denies changes in her health since last f/u. She continues tamoxifen. Denies any appreciable side effects. Denies bone/joint pain, hot flashes. Denies abnormal vaginal bleeding or GI bleeding. Denies new concerns in her breasts. No recent fever, chills, cough, chest pain, dyspnea, or leg swelling. She occasionally has "marks" on her leg like a bruise that resolves. No other skin issues. She had a mammogram at [facility] in September or October.    MEDICAL HISTORY:  Past Medical History:  Diagnosis Date  . Breast mass, left   . Hypothyroidism   . Seasonal allergies   . Sebaceous cyst    right buttock  . Thyroid disease     SURGICAL HISTORY: Past Surgical History:  Procedure Laterality Date  . BREAST BIOPSY    . BREAST EXCISIONAL BIOPSY    . BREAST LUMPECTOMY WITH RADIOACTIVE SEED LOCALIZATION Left 09/15/2017   Procedure: BREAST LUMPECTOMY WITH RADIOACTIVE SEED LOCALIZATION;  Surgeon: Coralie Keens, MD;  Location: [place];  Service: General;  Laterality: Left;  . CYST EXCISION Right 09/15/2017   Procedure: EXCISION OF 1CM RIGHT BUTTOCK SEBACEOUS CYST;  Surgeon: Coralie Keens, MD;  Location: [place];  Service: General;  Laterality: Right;    I have reviewed the social history and family history with the patient and they are unchanged from previous note.  ALLERGIES:  is allergic to erythromycin; shellfish allergy; and shellfish-derived products.  MEDICATIONS:  Current Outpatient Medications  Medication Sig Dispense Refill  . fluticasone (FLONASE) 50 MCG/ACT nasal spray Place into both nostrils daily.    Marland Kitchen levocetirizine (XYZAL) 5 MG tablet TAKE 1 TABLET (5 MG TOTAL) BY MOUTH EVERY EVENING. MUST MAKE APPOINTMENT 90 tablet 0  .  meclizine (ANTIVERT) 25 MG tablet Take 1 tablet (25 mg total) by mouth 3 (three) times daily as needed for dizziness or nausea. 30 tablet 0  . Multiple Vitamin (MULTIVITAMIN) tablet  Take 1 tablet by mouth daily.    Marland Kitchen SYNTHROID 88 MCG tablet Take 1 tablet (88 mcg total) by mouth daily. 90 tablet 3  . tamoxifen (NOLVADEX) 20 MG tablet Take 1 tablet (20 mg total) by mouth daily. 90 tablet 3   No current facility-administered medications for this visit.    PHYSICAL EXAMINATION: ECOG PERFORMANCE STATUS: 0 - Asymptomatic  There were no vitals filed for this visit. There were no vitals filed for this visit.  GENERAL:alert, no distress and comfortable SKIN: no obvious rash  HEENT: sclera anicteric  LUNGS: normal breathing effort Musculoskeletal: no obvious swelling of extremities when video positioned to lower legs   NEURO: alert & oriented x 3 with fluent speech, mood and affect appear normal   LABORATORY DATA:  No labs for today's visit.   RADIOGRAPHIC STUDIES: I have personally reviewed the radiological images as listed and agreed with the findings in the report. No results found.   ASSESSMENT & PLAN: Sheri [name] is a 48 y.o. female with   1. Lobular carcinoma insituof left breast -Diagnosed in 10/2017. S/p left lumpectomy -to reduce future risk of recurrence she is on tamoxifen, tolerating well without side effects.  -her MRI in 07/2018 was unremarkable, she states she had a recent mammogram that was negative. Will obtain report from [facility]. No clinical concern for recurrence.  -Continue surveillance and tamoxifen.  -Next MRI in 04/2019 -Due to the nature of today's virtual visit, will do in-person visit in 3 months with lab and breast exam.  -she knows to call sooner with any new concerns.   2. Right breast 7 mm mas in LOQ right breast 12/2017, bx showed fibrocystic changes without evidence of malignancy  -07/11/18 MRI shows right breast mass is smaller and histologically benign. No further evaluation needed.  -will monitor on subsequent imaging  PLAN: -Continue tamoxifen and surveillance  -Lab, f/u in 3 months with breast  exam -Obtain mammogram report from [facility] -Next MRI 6 months after mammo, likely 04/2019  Orders Placed This Encounter  Procedures  . MR BREAST BILATERAL W WO CONTRAST INC CAD    Standing Status:   Future    Standing Expiration Date:   03/17/2020    Order Specific Question:   ** REASON FOR EXAM (FREE TEXT)    Answer:   h/o LCIS, alternate with mammogram, screening    Order Specific Question:   If indicated for the ordered procedure, I authorize the administration of contrast media per Radiology protocol    Answer:   Yes    Order Specific Question:   What is the patient's sedation requirement?    Answer:   No Sedation    Order Specific Question:   Does the patient have a pacemaker or implanted devices?    Answer:   No    Order Specific Question:   Radiology Contrast Protocol - do NOT remove file path    Answer:   \\[email or web address]    Order Specific Question:   Preferred imaging location?    Answer:   [facility] (table limit-350 lbs)    All questions were answered. The patient knows to call the clinic with any problems, questions or concerns. No barriers to learning was detected. A total of 12 minutes were spent on  today's video-enabled encounter.     Alla Feeling, NP 01/15/19

## 2019-01-16 ENCOUNTER — Telehealth: Payer: Self-pay | Admitting: Hematology

## 2019-01-16 ENCOUNTER — Telehealth: Payer: Self-pay

## 2019-01-16 NOTE — Telephone Encounter (Signed)
TC to [facility] [place] [phone]) per Cira Rue NP to to get recent mammogram results. They stated that they would fax them over to [phone]

## 2019-01-16 NOTE — Telephone Encounter (Signed)
Scheduled appt per 12/15 sch message - mailed reminder letter with appt date and time

## 2019-01-16 NOTE — Telephone Encounter (Signed)
Progress note: Received fax from [facility] [place] [phone]) of recent mammogram results. Gave them to Cira Rue NP

## 2019-01-17 ENCOUNTER — Encounter: Payer: Self-pay | Admitting: Nurse Practitioner

## 2019-01-17 NOTE — Progress Notes (Signed)
I received and reviewed outside bilateral mammogram done on 08/23/18 at [facility], which is negative for malignancy. Will request her screening MRI to be done in 03/2019.  Cira Rue, NP  01/17/2019

## 2019-01-23 ENCOUNTER — Other Ambulatory Visit: Payer: Self-pay | Admitting: Hematology

## 2019-02-05 ENCOUNTER — Encounter: Payer: Self-pay | Admitting: Hematology

## 2019-02-22 ENCOUNTER — Other Ambulatory Visit: Payer: Self-pay | Admitting: Osteopathic Medicine

## 2019-02-22 ENCOUNTER — Encounter: Payer: Self-pay | Admitting: Osteopathic Medicine

## 2019-02-22 MED ORDER — LEVOCETIRIZINE DIHYDROCHLORIDE 5 MG PO TABS: 5.0000 mg | ORAL_TABLET | Freq: Every evening | ORAL | 0 refills | Status: DC

## 2019-03-16 ENCOUNTER — Other Ambulatory Visit: Payer: Self-pay | Admitting: Osteopathic Medicine

## 2019-04-09 NOTE — Progress Notes (Signed)
Sheri [name]   Telephone:(336) [phone] Fax:(336) [phone]   Clinic Follow up Note   Patient Care Team: Emeterio Reeve, DO as PCP - General (Osteopathic Medicine) Coralie Keens, MD as Consulting Physician (General Surgery) Truitt Merle, MD as Consulting Physician (Hematology)  Date of Service:  04/17/2019  CHIEF COMPLAINT: F/u onlobular carcinoma in [place] left breast  SUMMARY OF ONCOLOGIC HISTORY: Oncology History  Lobular carcinoma in situ (LCIS) of left breast  08/15/2017 Initial Biopsy   Diagnosis Breast, left, needle core biopsy, upper outer quadrant - LOBULAR NEOPLASIA (ATYPICAL LOBULAR HYPERPLASIA). - FIBROCYSTIC CHANGE AND ADENOSIS WITH CALCIFICATIONS. - PSEUDOANGIOMATOUS STROMAL HYPERPLASIA ([place]).   09/15/2017 Pathology Results   Diagnosis Breast, lumpectomy, left - LOBULAR NEOPLASIA (LOBULAR CARCINOMA IN SITU). - HEALING BIOPSY SITE. - SEE COMMENT.   10/07/2017 Initial Diagnosis   Lobular carcinoma in situ (LCIS) of left breast   10/10/2018 -  Anti-estrogen oral therapy   Tamoxifen started 10/09/2017      CURRENT THERAPY:  Tamoxifen 20mg  daily started 10/09/2017   INTERVAL HISTORY:  Sheri [name] is here for a follow up of left breast LCIS. She was last seen by me 8 months ago. She presents to the clinic alone. She notes she is doing well. She is tolerating Tamoxifen well with no major side effects. She notes she still has periods. She tried calcium supplement last year but stopped after 2 days because she felt shaky.      REVIEW OF SYSTEMS:   Constitutional: Denies fevers, chills or abnormal weight loss Eyes: Denies blurriness of vision Ears, nose, mouth, throat, and face: Denies mucositis or sore throat Respiratory: Denies cough, dyspnea or wheezes Cardiovascular: Denies palpitation, chest discomfort or lower extremity swelling Gastrointestinal:  Denies nausea, heartburn or change in bowel habits Skin: Denies abnormal skin  rashes Lymphatics: Denies new lymphadenopathy or easy bruising Neurological:Denies numbness, tingling or new weaknesses Behavioral/Psych: Mood is stable, no new changes  All other systems were reviewed with the patient and are negative.  MEDICAL HISTORY:  Past Medical History:  Diagnosis Date  . Breast mass, left   . Hypothyroidism   . Seasonal allergies   . Sebaceous cyst    right buttock  . Thyroid disease     SURGICAL HISTORY: Past Surgical History:  Procedure Laterality Date  . BREAST BIOPSY    . BREAST EXCISIONAL BIOPSY    . BREAST LUMPECTOMY WITH RADIOACTIVE SEED LOCALIZATION Left 09/15/2017   Procedure: BREAST LUMPECTOMY WITH RADIOACTIVE SEED LOCALIZATION;  Surgeon: Coralie Keens, MD;  Location: [place];  Service: General;  Laterality: Left;  . CYST EXCISION Right 09/15/2017   Procedure: EXCISION OF 1CM RIGHT BUTTOCK SEBACEOUS CYST;  Surgeon: Coralie Keens, MD;  Location: [place];  Service: General;  Laterality: Right;    I have reviewed the social history and family history with the patient and they are unchanged from previous note.  ALLERGIES:  is allergic to other; erythromycin; shellfish allergy; and shellfish-derived products.  MEDICATIONS:  Current Outpatient Medications  Medication Sig Dispense Refill  . fluticasone (FLONASE) 50 MCG/ACT nasal spray Place into both nostrils daily.    Marland Kitchen levocetirizine (XYZAL) 5 MG tablet Take 1 tablet (5 mg total) by mouth every evening. Must make Appointment 30 tablet 0  . meclizine (ANTIVERT) 25 MG tablet Take 1 tablet (25 mg total) by mouth 3 (three) times daily as needed for dizziness or nausea. (Patient not taking: Reported on 04/17/2019) 30 tablet 0  . Multiple Vitamin (MULTIVITAMIN) tablet  Take 1 tablet by mouth daily.    Marland Kitchen SYNTHROID 88 MCG tablet Take 1 tablet (88 mcg total) by mouth daily. 90 tablet 3  . tamoxifen (NOLVADEX) 20 MG tablet TAKE 1 TABLET BY MOUTH EVERY DAY 90 tablet 3    No current facility-administered medications for this visit.    PHYSICAL EXAMINATION: ECOG PERFORMANCE STATUS: 0 - Asymptomatic  Vitals:   04/17/19 1429  BP: 107/66  Pulse: 87  Resp: 16  Temp: 98 F (36.7 C)  SpO2: 100%   Filed Weights   04/17/19 1429  Weight: 140 lb 9.6 oz (63.8 kg)    GENERAL:alert, no distress and comfortable SKIN: skin color, texture, turgor are normal, no rashes or significant lesions EYES: normal, Conjunctiva are pink and non-injected, sclera clear  NECK: supple, thyroid normal size, non-tender, without nodularity LYMPH:  no palpable lymphadenopathy in the cervical, axillary  LUNGS: clear to auscultation and percussion with normal breathing effort HEART: regular rate & rhythm and no murmurs and no lower extremity edema ABDOMEN:abdomen soft, non-tender and normal bowel sounds Musculoskeletal:no cyanosis of digits and no clubbing  NEURO: alert & oriented x 3 with fluent speech, no focal motor/sensory deficits BREAST: S/p left lumpectomy: Surgical incision healed well. (+) Skin retraction of left lateral breast. No palpable mass, nodules or adenopathy bilaterally. Breast exam benign.   LABORATORY DATA:  I have reviewed the data as listed CBC Latest Ref Rng & Units 04/17/2019 04/17/2019 07/13/2018  WBC 4.0 - 10.5 K/uL 4.9 4.1 4.7  Hemoglobin 12.0 - 15.0 g/dL 11.9(L) 12.3 12.0  Hematocrit 36.0 - 46.0 % 36.7 37.1 37.4  Platelets 150 - 400 K/uL 215 209 200     CMP Latest Ref Rng & Units 04/17/2019 07/13/2018 01/08/2018  Glucose 70 - 99 mg/dL 100(H) 122(H) 102(H)  BUN 6 - 20 mg/dL 13 14 18   Creatinine 0.44 - 1.00 mg/dL 0.77 0.78 0.74  Sodium 135 - 145 mmol/L 139 141 142  Potassium 3.5 - 5.1 mmol/L 3.9 3.5 3.7  Chloride 98 - 111 mmol/L 109 109 108  CO2 22 - 32 mmol/L 25 25 26   Calcium 8.9 - 10.3 mg/dL 8.3(L) 8.7(L) 9.0  Total Protein 6.5 - 8.1 g/dL 6.3(L) 6.5 6.7  Total Bilirubin 0.3 - 1.2 mg/dL 0.3 0.2(L) 0.3  Alkaline Phos 38 - 126 U/L 56 61 76  AST  15 - 41 U/L 23 25 36  ALT 0 - 44 U/L 14 20 43      RADIOGRAPHIC STUDIES: I have personally reviewed the radiological images as listed and agreed with the findings in the report. No results found.   ASSESSMENT & PLAN:  Sheri [name] is a 49 y.o. female with    1. Lobular carcinoma insituof left breast, new left breast mass on MRI  -Diagnosed in 10/2017. Treated with left lumpectomy. Currently on Tamoxifento reduce her future risk of breast cancer.Tolerating well with minimal side effects. -She notes having irregular periods. With a lighter cycle she will have a heavier cycle in that same month. I recommend she discuss with Gyn in 08/2018 visit. There is a small risk of endometrial cancer with Tamoxifen.  -She is clinically doing well. Lab reviewed, her CBC and CMP are within normal limits except Hg 11.9, BG 100, Ca 8.3, Protein 6.3, albumin 3.4. Her physical exam was unremarkable with stable skin retraction or left breast. -Her 08/2018 Mammogram was benign but her 04/12/19 MRI breast showed Indeterminate linear non mass enhancement in left breast. A biopsy was recommended. She  is agreeable.  -Continue surveillance and Continue Tamoxifen -F/u in 1 year or sooner if biopsy shows cancer   2. Hypocalcemia  -Ca 8.3 today (04/17/19) -She tried oral calcium last year but stopped after 2 days because she felt shaky. I encouraged her to try again and add Vit D daily. She is willing to try again.    Plan -Left breast biopsy ASAP -continue tamoxifen -lab and f/u in 1 year with breast MRI a week before, or sooner if biopsy shows cancer  -Mammogram in 08/2019 at her GYN office    No problem-specific Assessment & Plan notes found for this encounter.   Orders Placed This Encounter  Procedures  . MR LT BREAST BX W LOC DEV 1ST LESION IMAGE BX SPEC MR GUIDE    Standing Status:   Future    Standing Expiration Date:   06/16/2020    Order Specific Question:   Reason for Exam (SYMPTOM  OR  DIAGNOSIS REQUIRED)    Answer:   MRI showed left breast mass, rule out malignancy    Order Specific Question:   Preferred Imaging Location?    Answer:   [facility] (table limit-550lbs)  . MR BREAST BILATERAL W WO CONTRAST INC CAD    Standing Status:   Future    Standing Expiration Date:   06/16/2020    Order Specific Question:   If indicated for the ordered procedure, I authorize the administration of contrast media per Radiology protocol    Answer:   Yes    Order Specific Question:   What is the patient's sedation requirement?    Answer:   No Sedation    Order Specific Question:   Does the patient have a pacemaker or implanted devices?    Answer:   No    Order Specific Question:   Radiology Contrast Protocol - do NOT remove file path    Answer:   \\[email or web address]    Order Specific Question:   Preferred imaging location?    Answer:   [facility] (table limit-550lbs)   All questions were answered. The patient knows to call the clinic with any problems, questions or concerns. No barriers to learning was detected. The total time spent in the appointment was 30 minutes.     Truitt Merle, MD 04/17/2019   I, Joslyn Devon, am acting as scribe for Truitt Merle, MD.   I have reviewed the above documentation for accuracy and completeness, and I agree with the above.

## 2019-04-12 ENCOUNTER — Ambulatory Visit (HOSPITAL_COMMUNITY)
Admission: RE | Admit: 2019-04-12 | Discharge: 2019-04-12 | Disposition: A | Discharge status: Home / Self Care | Payer: PRIVATE HEALTH INSURANCE | Source: Ambulatory Visit | Attending: Nurse Practitioner | Admitting: Nurse Practitioner

## 2019-04-12 ENCOUNTER — Other Ambulatory Visit: Payer: Self-pay

## 2019-04-12 DIAGNOSIS — D0502 Lobular carcinoma in situ of left breast: Secondary | ICD-10-CM

## 2019-04-12 MED ORDER — GADOBUTROL 1 MMOL/ML IV SOLN
6.0000 mL | Freq: Once | INTRAVENOUS | Status: AC | PRN
  Administered 2019-04-12: 6 mL via INTRAVENOUS

## 2019-04-17 ENCOUNTER — Other Ambulatory Visit: Payer: Self-pay

## 2019-04-17 ENCOUNTER — Encounter: Payer: Self-pay | Admitting: Hematology

## 2019-04-17 ENCOUNTER — Encounter: Payer: Self-pay | Admitting: Osteopathic Medicine

## 2019-04-17 ENCOUNTER — Ambulatory Visit (INDEPENDENT_AMBULATORY_CARE_PROVIDER_SITE_OTHER): Payer: PRIVATE HEALTH INSURANCE | Admitting: Osteopathic Medicine

## 2019-04-17 ENCOUNTER — Encounter: Payer: Self-pay | Admitting: Gastroenterology

## 2019-04-17 ENCOUNTER — Inpatient Hospital Stay: Payer: PRIVATE HEALTH INSURANCE

## 2019-04-17 ENCOUNTER — Inpatient Hospital Stay: Payer: PRIVATE HEALTH INSURANCE | Attending: Nurse Practitioner | Admitting: Hematology

## 2019-04-17 VITALS — BP 107/66 | HR 87 | Temp 98.0°F | Resp 16 | Ht 65.5 in | Wt 140.6 lb

## 2019-04-17 VITALS — BP 108/73 | HR 75 | Temp 98.2°F | Ht 65.5 in | Wt 138.0 lb

## 2019-04-17 DIAGNOSIS — Z1211 Encounter for screening for malignant neoplasm of colon: Secondary | ICD-10-CM

## 2019-04-17 DIAGNOSIS — Z79810 Long term (current) use of selective estrogen receptor modulators (SERMs): Secondary | ICD-10-CM | POA: Insufficient documentation

## 2019-04-17 DIAGNOSIS — D0502 Lobular carcinoma in situ of left breast: Secondary | ICD-10-CM | POA: Diagnosis present

## 2019-04-17 DIAGNOSIS — R7301 Impaired fasting glucose: Secondary | ICD-10-CM | POA: Diagnosis not present

## 2019-04-17 DIAGNOSIS — Z0000 Encounter for general adult medical examination without abnormal findings: Secondary | ICD-10-CM

## 2019-04-17 DIAGNOSIS — J302 Other seasonal allergic rhinitis: Secondary | ICD-10-CM | POA: Diagnosis not present

## 2019-04-17 DIAGNOSIS — E039 Hypothyroidism, unspecified: Secondary | ICD-10-CM | POA: Diagnosis not present

## 2019-04-17 DIAGNOSIS — Z170 Estrogen receptor positive status [ER+]: Secondary | ICD-10-CM | POA: Diagnosis not present

## 2019-04-17 DIAGNOSIS — N926 Irregular menstruation, unspecified: Secondary | ICD-10-CM | POA: Diagnosis not present

## 2019-04-17 LAB — CMP (CANCER CENTER ONLY)
ALT: 14 U/L (ref 0–44)
AST: 23 U/L (ref 15–41)
Albumin: 3.4 g/dL — ABNORMAL LOW (ref 3.5–5.0)
Alkaline Phosphatase: 56 U/L (ref 38–126)
Anion gap: 5 (ref 5–15)
BUN: 13 mg/dL (ref 6–20)
CO2: 25 mmol/L (ref 22–32)
Calcium: 8.3 mg/dL — ABNORMAL LOW (ref 8.9–10.3)
Chloride: 109 mmol/L (ref 98–111)
Creatinine: 0.77 mg/dL (ref 0.44–1.00)
GFR, Est AFR Am: >60 mL/min (ref >60)
GFR, Estimated: >60 mL/min (ref >60)
Glucose, Bld: 100 mg/dL — ABNORMAL HIGH (ref 70–99)
Potassium: 3.9 mmol/L (ref 3.5–5.1)
Sodium: 139 mmol/L (ref 135–145)
Total Bilirubin: 0.3 mg/dL (ref 0.3–1.2)
Total Protein: 6.3 g/dL — ABNORMAL LOW (ref 6.5–8.1)

## 2019-04-17 LAB — CBC WITH DIFFERENTIAL (CANCER CENTER ONLY)
Abs Immature Granulocytes: 0.01 10*3/uL (ref 0.00–0.07)
Basophils Absolute: 0 10*3/uL (ref 0.0–0.1)
Basophils Relative: 0 %
Eosinophils Absolute: 0.1 10*3/uL (ref 0.0–0.5)
Eosinophils Relative: 2 %
HCT: 36.7 % (ref 36.0–46.0)
Hemoglobin: 11.9 g/dL — ABNORMAL LOW (ref 12.0–15.0)
Immature Granulocytes: 0 %
Lymphocytes Relative: 37 %
Lymphs Abs: 1.8 10*3/uL (ref 0.7–4.0)
MCH: 29.4 pg (ref 26.0–34.0)
MCHC: 32.4 g/dL (ref 30.0–36.0)
MCV: 90.6 fL (ref 80.0–100.0)
Monocytes Absolute: 0.3 10*3/uL (ref 0.1–1.0)
Monocytes Relative: 7 %
Neutro Abs: 2.6 10*3/uL (ref 1.7–7.7)
Neutrophils Relative %: 54 %
Platelet Count: 215 10*3/uL (ref 150–400)
RBC: 4.05 MIL/uL (ref 3.87–5.11)
RDW: 13.2 % (ref 11.5–15.5)
WBC Count: 4.9 10*3/uL (ref 4.0–10.5)
nRBC: 0 % (ref 0.0–0.2)

## 2019-04-17 NOTE — Patient Instructions (Addendum)
General Preventive Care  Most recent routine screening lipids/other labs: ordered today.   Note: Thyroid, Vitamin D, A1C for diabetes screening: insurance may not cover this test as part of "free labs" on your annual physical. If you desire or need this testing, you may be charged for it! Please check with the lab before your blood draw if you're concerned about cost.   Tobacco: don't! Alcohol: responsible moderation is ok for most adults - if you have concerns about your alcohol intake, please talk to me!   Exercise: as tolerated to reduce risk of cardiovascular disease and diabetes. Strength training will also prevent osteoporosis.   Mental health: if need for mental health care (medicines, counseling, other), or concerns about moods, please let me know!   Sexual health: if need for STD testing, or if concerns with libido/pain problems, please let me know!   Advanced Directive: Living Will and/or Healthcare Power of Attorney recommended for all adults, regardless of age or health.  Vaccines  Flu vaccine: recommended for almost everyone, every fall.   Shingles vaccine: Shingrix recommended after age 40.   Pneumonia vaccines: Prevnar and Pneumovax recommended after age 41, or sooner if certain medical conditions.  Tetanus booster: Tdap recommended every 10 years. Due 2028  COVID vaccine recommended!  Cancer screenings   Colon cancer screening: recommended age 71-75, some insurance may not cover until age 46 (guidelines are changing)  Breast cancer screening: per OBGYN / Oncology  Cervical cancer screening: per OBGYN  Lung cancer screening: not needed for non-smokers  Infection screenings . HIV, Gonorrhea/Chlamydia: screening as needed . Hepatitis C: recommended for anyone born 79-1964 . TB: certain at-risk populations, or depending on work requirements and/or travel history Other . Bone Density Test: recommended for women at age 70

## 2019-04-17 NOTE — Progress Notes (Signed)
HPI: Sheri [name] is a 49 y.o. female  who presents to [facility] today, 04/17/19,  for chief complaint of:  Annual check-up  Patient here for annual physical / wellness exam.  See preventive care reviewed as below.  .    Past medical, surgical, social and family history reviewed: Patient Active Problem List   Diagnosis Date Noted  . Lobular carcinoma in situ (LCIS) of left breast 10/07/2017  . Vertigo 12/28/2016  . Eye abnormality 05/27/2016  . Annual physical exam 05/27/2016  . Hypothyroidism 07/07/2012   Past Surgical History:  Procedure Laterality Date  . BREAST BIOPSY    . BREAST EXCISIONAL BIOPSY    . BREAST LUMPECTOMY WITH RADIOACTIVE SEED LOCALIZATION Left 09/15/2017   Procedure: BREAST LUMPECTOMY WITH RADIOACTIVE SEED LOCALIZATION;  Surgeon: Coralie Keens, MD;  Location: [place];  Service: General;  Laterality: Left;  . CYST EXCISION Right 09/15/2017   Procedure: EXCISION OF 1CM RIGHT BUTTOCK SEBACEOUS CYST;  Surgeon: Coralie Keens, MD;  Location: [place];  Service: General;  Laterality: Right;   Social History   Tobacco Use  . Smoking status: Never Smoker  . Smokeless tobacco: Never Used  Substance Use Topics  . Alcohol use: No   Family History  Problem Relation Age of Onset  . Diabetes Mother   . Hypertension Father   . COPD Father   . Cancer Paternal Aunt 14       breast cancer  . Cancer Paternal Aunt 61       breast cancer  . Cancer Cousin 50       breast cancer  . Cancer Cousin        breast cancer  . Cancer Other 35       breast cancer     Current medication list and allergy/intolerance information reviewed:   Current Outpatient Medications  Medication Sig Dispense Refill  . fluticasone (FLONASE) 50 MCG/ACT nasal spray Place into both nostrils daily.    Marland Kitchen levocetirizine (XYZAL) 5 MG tablet Take 1 tablet (5 mg total) by mouth every evening. Must make  Appointment 30 tablet 0  . Multiple Vitamin (MULTIVITAMIN) tablet Take 1 tablet by mouth daily.    Marland Kitchen SYNTHROID 88 MCG tablet Take 1 tablet (88 mcg total) by mouth daily. 90 tablet 3  . tamoxifen (NOLVADEX) 20 MG tablet TAKE 1 TABLET BY MOUTH EVERY DAY 90 tablet 3  . meclizine (ANTIVERT) 25 MG tablet Take 1 tablet (25 mg total) by mouth 3 (three) times daily as needed for dizziness or nausea. (Patient not taking: Reported on 04/17/2019) 30 tablet 0   No current facility-administered medications for this visit.   Allergies  Allergen Reactions  . Other Swelling  . Erythromycin     Other reaction(s): GI UPSET  . Shellfish Allergy   . Shellfish-Derived Products       Review of Systems:  Constitutional:  No  fever, no chills, No recent illness, No unintentional weight changes. No significant fatigue.   HEENT: No  headache, no vision change, no hearing change, No sore throat, +allergy-related sinus pressure  Cardiac: No  chest pain, No  pressure, No palpitations, No  Orthopnea  Respiratory:  No  shortness of breath. No  Cough  Gastrointestinal: No  abdominal pain, No  nausea, No  vomiting,  No  blood in stool, No  diarrhea, No  constipation   Musculoskeletal: No new myalgia/arthralgia  Genitourinary: No  incontinence, No  abnormal genital  bleeding, No abnormal genital discharge  Skin: No  Rash, No other wounds/concerning lesions  Hem/Onc: No  easy bruising/bleeding, No  abnormal lymph node  Endocrine: No cold intolerance,  No heat intolerance. No polyuria/polydipsia/polyphagia   Neurologic: No  weakness, No  dizziness,  Psychiatric: No  concerns with depression, No  concerns with anxiety, No sleep problems, No mood problems  Exam:  BP 108/73 (BP Location: Left Arm, Patient Position: Sitting, Cuff Size: Normal)   Pulse 75   Temp 98.2 F (36.8 C) (Oral)   Ht 5' 5.5" (1.664 m)   Wt 138 lb 0.6 oz (62.6 kg)   BMI 22.62 kg/m   Constitutional: VS see above. General Appearance:  alert, well-developed, well-nourished, NAD  Eyes: Normal lids and conjunctive, non-icteric sclera. Opaque than white line bordering outside of the iris.  Ears, Nose, Mouth, Throat: TM normal bilaterally.  Neck: No masses, trachea midline. No thyroid enlargement. No tenderness/mass appreciated. No lymphadenopathy  Respiratory: Normal respiratory effort. no wheeze, no rhonchi, no rales  Cardiovascular: S1/S2 normal, no murmur, no rub/gallop auscultated. RRR. No lower extremity edema.   Gastrointestinal: Nontender, no masses. No hepatomegaly, no splenomegaly. No hernia appreciated. Bowel sounds normal. Rectal exam deferred.   Musculoskeletal: Gait normal. No clubbing/cyanosis of digits.   Neurological: Normal balance/coordination. No tremor. No cranial nerve deficit on limited exam. Motor and sensation intact and symmetric. Cerebellar reflexes intact.   Skin: warm, dry, intact. No rash/ulcer. No concerning nevi or subq nodules on limited exam.    Psychiatric: Normal judgment/insight. Normal mood and affect. Oriented x3.      ASSESSMENT/PLAN:   The primary encounter diagnosis was Annual physical exam. Diagnoses of Elevated fasting glucose, Seasonal allergies, Hypothyroidism, unspecified type, Lobular carcinoma in situ (LCIS) of left breast, and Colon cancer screening were also pertinent to this visit.    FEMALE PREVENTIVE CARE Updated 04/17/19   ANNUAL SCREENING/COUNSELING  Diet/Exercise - HEALTHY HABITS DISCUSSED TO DECREASE CV RISK Social History   Tobacco Use  Smoking Status Never Smoker  Smokeless Tobacco Never Used   Social History   Substance and Sexual Activity  Alcohol Use No   Depression screen PHQ 2/9 04/17/2019  Decreased Interest 0  Down, Depressed, Hopeless 0  PHQ - 2 Score 0  Altered sleeping 1  Tired, decreased energy 0  Change in appetite 0  Feeling bad or failure about yourself  0  Trouble concentrating 0  Moving slowly or fidgety/restless 0   Suicidal thoughts 0  PHQ-9 Score 1  Difficult doing work/chores Not difficult at all    Domestic violence concerns - no  HTN SCREENING - SEE [place]  Sexually active in the past year - Yes with female.  Need/want STI testing today? - no  Concerns about libido or pain with sex? - no  Plans for pregnancy? - husband has vasectomy   INFECTIOUS DISEASE SCREENING  HIV - declined  GC/CT - does not need  HepC - DOB [birth year] - does not need  TB - does not need  DISEASE SCREENING  Lipid - needs - usually gets through husband's employer   DM2 - needs   Osteoporosis - women age 25+ - does not need  CANCER SCREENING  Cervical - does not need - following with OBGYN   Breast - following with OBGYN & Oncology  Lung - does not need  Colon - no FH, new guidelines suggest start screening at age 94   ADULT VACCINATION  Influenza - annual vaccine recommended  Td - booster every 10 years - needs booster 2028  Zoster - option at 50  PCV13 - was not indicated  PPSV23 - was not indicated  COVID: recommended  Immunization History  Administered Date(s) Administered  . Influenza Split 03/20/2012  . Influenza,inj,Quad PF,6+ Mos 10/31/2012, 11/05/2016  . Influenza-Unspecified 11/09/2017, 11/08/2018  . Td 07/02/2002  . Tdap 05/26/2016     Patient Instructions  General Preventive Care  Most recent routine screening lipids/other labs: ordered today.   Note: Thyroid, Vitamin D, A1C for diabetes screening: insurance may not cover this test as part of "free labs" on your annual physical. If you desire or need this testing, you may be charged for it! Please check with the lab before your blood draw if you're concerned about cost.   Tobacco: don't! Alcohol: responsible moderation is ok for most adults - if you have concerns about your alcohol intake, please talk to me!   Exercise: as tolerated to reduce risk of cardiovascular disease and diabetes. Strength  training will also prevent osteoporosis.   Mental health: if need for mental health care (medicines, counseling, other), or concerns about moods, please let me know!   Sexual health: if need for STD testing, or if concerns with libido/pain problems, please let me know!   Advanced Directive: Living Will and/or Healthcare Power of Attorney recommended for all adults, regardless of age or health.  Vaccines  Flu vaccine: recommended for almost everyone, every fall.   Shingles vaccine: Shingrix recommended after age 32.   Pneumonia vaccines: Prevnar and Pneumovax recommended after age 80, or sooner if certain medical conditions.  Tetanus booster: Tdap recommended every 10 years. Due 2028  COVID vaccine recommended!  Cancer screenings   Colon cancer screening: recommended age 50-75, some insurance may not cover until age 23 (guidelines are changing)  Breast cancer screening: per OBGYN / Oncology  Cervical cancer screening: per OBGYN  Lung cancer screening: not needed for non-smokers  Infection screenings . HIV, Gonorrhea/Chlamydia: screening as needed . Hepatitis C: recommended for anyone born 71-1964 . TB: certain at-risk populations, or depending on work requirements and/or travel history Other . Bone Density Test: recommended for women at age 62     Visit summary with medication list and pertinent instructions was printed for patient to review. All questions at time of visit were answered - patient instructed to contact office with any additional concerns. ER/RTC precautions were reviewed with the patient. Follow-up plan: Return in about 1 year (around 04/16/2020) for [place] (call week prior to visit for lab orders).

## 2019-04-18 ENCOUNTER — Other Ambulatory Visit: Payer: Self-pay | Admitting: Hematology

## 2019-04-18 ENCOUNTER — Encounter: Payer: Self-pay | Admitting: Osteopathic Medicine

## 2019-04-18 ENCOUNTER — Telehealth: Payer: Self-pay | Admitting: Hematology

## 2019-04-18 ENCOUNTER — Telehealth: Payer: Self-pay | Admitting: Osteopathic Medicine

## 2019-04-18 DIAGNOSIS — D0502 Lobular carcinoma in situ of left breast: Secondary | ICD-10-CM

## 2019-04-18 LAB — CBC
HCT: 37.1 % (ref 35.0–45.0)
Hemoglobin: 12.3 g/dL (ref 11.7–15.5)
MCH: 29.6 pg (ref 27.0–33.0)
MCHC: 33.2 g/dL (ref 32.0–36.0)
MCV: 89.2 fL (ref 80.0–100.0)
MPV: 12.1 fL (ref 7.5–12.5)
Platelets: 209 10*3/uL (ref 140–400)
RBC: 4.16 10*6/uL (ref 3.80–5.10)
RDW: 13 % (ref 11.0–15.0)
WBC: 4.1 10*3/uL (ref 3.8–10.8)

## 2019-04-18 LAB — COMPLETE METABOLIC PANEL WITH GFR
AG Ratio: 1.6 (calc) (ref 1.0–2.5)
ALT: 16 U/L (ref 6–29)
AST: 25 U/L (ref 10–35)
Albumin: 3.9 g/dL (ref 3.6–5.1)
Alkaline phosphatase (APISO): 50 U/L (ref 31–125)
BUN: 13 mg/dL (ref 7–25)
CO2: 27 mmol/L (ref 20–32)
Calcium: 9.1 mg/dL (ref 8.6–10.2)
Chloride: 109 mmol/L (ref 98–110)
Creat: 0.69 mg/dL (ref 0.50–1.10)
GFR, Est African American: 119 mL/min/{1.73_m2} (ref >=60)
GFR, Est Non African American: 103 mL/min/{1.73_m2} (ref >=60)
Globulin: 2.4 g/dL (calc) (ref 1.9–3.7)
Glucose, Bld: 87 mg/dL (ref 65–99)
Potassium: 4.2 mmol/L (ref 3.5–5.3)
Sodium: 140 mmol/L (ref 135–146)
Total Bilirubin: 0.5 mg/dL (ref 0.2–1.2)
Total Protein: 6.3 g/dL (ref 6.1–8.1)

## 2019-04-18 LAB — LIPID PANEL
Cholesterol: 191 mg/dL (ref <200)
HDL: 96 mg/dL (ref >=50)
LDL Cholesterol (Calc): 81 mg/dL (calc)
Non-HDL Cholesterol (Calc): 95 mg/dL (calc) (ref <130)
Total CHOL/HDL Ratio: 2 (calc) (ref <5.0)
Triglycerides: 62 mg/dL (ref <150)

## 2019-04-18 LAB — HEMOGLOBIN A1C
Hgb A1c MFr Bld: 5.1 % of total Hgb (ref <5.7)
Mean Plasma Glucose: 100 (calc)
eAG (mmol/L): 5.5 (calc)

## 2019-04-18 LAB — TSH: TSH: 3.1 mIU/L

## 2019-04-18 NOTE — Telephone Encounter (Signed)
Scheduled appt per 3/17 los.  Sent a message to HIM pool to get a calendar mailed out. 

## 2019-04-18 NOTE — Telephone Encounter (Signed)
Please call patient: I filled out her form, I think there is a spot on here where we need the name of the spouse that is employed by [employer] plus a release signature? Can leave form up front to fill out and we can send it. Or can mail it?

## 2019-04-19 ENCOUNTER — Other Ambulatory Visit: Payer: Self-pay

## 2019-04-19 MED ORDER — LEVOCETIRIZINE DIHYDROCHLORIDE 5 MG PO TABS: 5.0000 mg | ORAL_TABLET | Freq: Every evening | ORAL | 3 refills | Status: DC

## 2019-04-19 MED ORDER — SYNTHROID 88 MCG PO TABS: 88.0000 ug | ORAL_TABLET | Freq: Every day | ORAL | 1 refills | Status: DC

## 2019-04-19 NOTE — Telephone Encounter (Signed)
Thyroid levels was within normal range. Med refill for thyroid 88 mcg (#90, 1 refill) sent to [facility]. Pt informed via MyChart message.

## 2019-04-19 NOTE — Telephone Encounter (Signed)
Sheri [name] states she doesn't need the form. She states it just needs to be scanned into the chart. She did ask if her thyroid labs came back and does her thyroid medication dose change.

## 2019-04-26 ENCOUNTER — Other Ambulatory Visit: Payer: Self-pay

## 2019-04-26 ENCOUNTER — Other Ambulatory Visit: Payer: Self-pay | Admitting: Hematology

## 2019-04-26 ENCOUNTER — Ambulatory Visit
Admission: RE | Admit: 2019-04-26 | Discharge: 2019-04-26 | Disposition: A | Discharge status: Home / Self Care | Payer: PRIVATE HEALTH INSURANCE | Source: Ambulatory Visit | Attending: Hematology | Admitting: Hematology

## 2019-04-26 ENCOUNTER — Other Ambulatory Visit: Payer: Self-pay | Admitting: Neurology

## 2019-04-26 DIAGNOSIS — D0501 Lobular carcinoma in situ of right breast: Secondary | ICD-10-CM

## 2019-04-26 DIAGNOSIS — D0502 Lobular carcinoma in situ of left breast: Secondary | ICD-10-CM

## 2019-04-26 MED ORDER — GADOBUTROL 1 MMOL/ML IV SOLN
6.0000 mL | Freq: Once | INTRAVENOUS | Status: AC | PRN
  Administered 2019-04-26: 6 mL via INTRAVENOUS

## 2019-05-03 ENCOUNTER — Encounter: Payer: Self-pay | Admitting: Nurse Practitioner

## 2019-05-09 ENCOUNTER — Ambulatory Visit (AMBULATORY_SURGERY_CENTER): Payer: Self-pay | Admitting: *Deleted

## 2019-05-09 ENCOUNTER — Other Ambulatory Visit: Payer: Self-pay

## 2019-05-09 VITALS — Temp 98.2°F | Ht 65.5 in | Wt 137.8 lb

## 2019-05-09 DIAGNOSIS — Z1211 Encounter for screening for malignant neoplasm of colon: Secondary | ICD-10-CM

## 2019-05-09 MED ORDER — SUPREP BOWEL PREP KIT 17.5-3.13-1.6 GM/177ML PO SOLN: 1.0000 | Freq: Once | ORAL | 0 refills | Status: DC

## 2019-05-09 MED ORDER — SUPREP BOWEL PREP KIT 17.5-3.13-1.6 GM/177ML PO SOLN: 1.0000 | Freq: Once | ORAL | 0 refills | Status: AC

## 2019-05-09 NOTE — Addendum Note (Signed)
Addended by: Randall Hiss B on: 05/09/2019 11:53 AM   Modules accepted: Orders

## 2019-05-09 NOTE — Progress Notes (Signed)
Patient denies any allergies to egg or soy products. Patient denies complications with anesthesia/sedation.  Patient denies oxygen use at home and denies diet medications. Emmi instructions for colonoscopy explained and given to patient. Suprep coupon given at [facility] appt.  Patient received both covid vaccinations, last one on 05/04/19.  No covid test needed.

## 2019-05-19 ENCOUNTER — Other Ambulatory Visit: Payer: Self-pay

## 2019-05-19 ENCOUNTER — Ambulatory Visit (HOSPITAL_BASED_OUTPATIENT_CLINIC_OR_DEPARTMENT_OTHER)
Admission: RE | Admit: 2019-05-19 | Discharge: 2019-05-19 | Disposition: A | Discharge status: Home / Self Care | Payer: PRIVATE HEALTH INSURANCE | Source: Ambulatory Visit | Attending: Emergency Medicine | Admitting: Emergency Medicine

## 2019-05-19 ENCOUNTER — Encounter: Payer: Self-pay | Admitting: Emergency Medicine

## 2019-05-19 ENCOUNTER — Emergency Department
Admission: EM | Admit: 2019-05-19 | Discharge: 2019-05-19 | Disposition: A | Discharge status: Home / Self Care | Payer: PRIVATE HEALTH INSURANCE | Source: Home / Self Care

## 2019-05-19 DIAGNOSIS — R450 Nervousness: Secondary | ICD-10-CM | POA: Insufficient documentation

## 2019-05-19 DIAGNOSIS — Y939 Activity, unspecified: Secondary | ICD-10-CM | POA: Diagnosis not present

## 2019-05-19 DIAGNOSIS — Z79899 Other long term (current) drug therapy: Secondary | ICD-10-CM | POA: Insufficient documentation

## 2019-05-19 DIAGNOSIS — Y929 Unspecified place or not applicable: Secondary | ICD-10-CM | POA: Insufficient documentation

## 2019-05-19 DIAGNOSIS — S8011XA Contusion of right lower leg, initial encounter: Secondary | ICD-10-CM | POA: Diagnosis not present

## 2019-05-19 DIAGNOSIS — Y999 Unspecified external cause status: Secondary | ICD-10-CM | POA: Diagnosis not present

## 2019-05-19 DIAGNOSIS — S7011XA Contusion of right thigh, initial encounter: Secondary | ICD-10-CM | POA: Insufficient documentation

## 2019-05-19 NOTE — Discharge Instructions (Signed)
  Because we do not have ultrasound available at this facility in the evenings or on weekends, your test has been ordered to be performed at our sister site, [facility]. They are expecting you.  Please go through the Emergency Department entrance, let them know you are there for an outpatient ultrasound. They will direct you to the imaging department for your ultrasound.  You may wait in the waiting room for your results. If you have to leave before the results come back, please leave a good contact phone number where we can reach you.

## 2019-05-19 NOTE — ED Triage Notes (Signed)
Patient noticed bruise last night on inner upper right thigh; not tender or warm and no redness; concerned it may be DVT; no history of this. No known exposure to covid positive person.

## 2019-05-19 NOTE — ED Provider Notes (Signed)
[name]    CSN: [id number] Arrival date & time: 05/19/19  0813      History   Chief Complaint Chief Complaint  Patient presents with  . Leg Problem    HPI Sheri [name] is a 49 y.o. female.   HPI Sheri [name] is a 49 y.o. female presenting to UC with concern about a possible blood clot in her Right leg after noticing a bruise on her Right inner thigh last night.  She does not recall any trauma to her leg.  Denies pain, warmth, redness or swelling.  She does not have a hx of clots but she has been taking tamoxifen since 2019 for breast cancer and knows that clots area possible side effect. No family hx of clots. Denies chest pain or SOB. No other easy bleeding or bruising that she has noticed recently.  She received both doses of the pfizer Covid-19 vaccine, last dose was in March 2021.  No recent travel.  She did have a breast biopsy last month. No other recent surgeries.    Past Medical History:  Diagnosis Date  . Allergy   . Breast mass, left   . DCIS (ductal carcinoma in situ)    left breast  . Hypothyroidism   . Seasonal allergies   . Sebaceous cyst    right buttock  . Thyroid disease     Patient Active Problem List   Diagnosis Date Noted  . Lobular carcinoma in situ (LCIS) of left breast 10/07/2017  . Vertigo 12/28/2016  . Eye abnormality 05/27/2016  . Annual physical exam 05/27/2016  . Hypothyroidism 07/07/2012    Past Surgical History:  Procedure Laterality Date  . BREAST BIOPSY    . BREAST EXCISIONAL BIOPSY    . BREAST LUMPECTOMY WITH RADIOACTIVE SEED LOCALIZATION Left 09/15/2017   Procedure: BREAST LUMPECTOMY WITH RADIOACTIVE SEED LOCALIZATION;  Surgeon: Coralie Keens, MD;  Location: [place];  Service: General;  Laterality: Left;  . CYST EXCISION Right 09/15/2017   Procedure: EXCISION OF 1CM RIGHT BUTTOCK SEBACEOUS CYST;  Surgeon: Coralie Keens, MD;  Location: [place];  Service: General;   Laterality: Right;  . WISDOM TOOTH EXTRACTION      OB History   No obstetric history on file.      Home Medications    Prior to Admission medications   Medication Sig Start Date End Date Taking? Authorizing Provider  BIOTIN PO Take by mouth daily.    [provider]  fluticasone (FLONASE) 50 MCG/ACT nasal spray Place into both nostrils daily.    [provider]  levocetirizine (XYZAL) 5 MG tablet Take 1 tablet (5 mg total) by mouth every evening. 04/19/19   Emeterio Reeve, DO  Multiple Vitamin (MULTIVITAMIN) tablet Take 1 tablet by mouth daily.    [provider]  SYNTHROID 88 MCG tablet Take 1 tablet (88 mcg total) by mouth daily. 04/19/19   Emeterio Reeve, DO  tamoxifen (NOLVADEX) 20 MG tablet TAKE 1 TABLET BY MOUTH EVERY DAY 01/23/19   Truitt Merle, MD    Family History Family History  Problem Relation Age of Onset  . Diabetes Mother   . Hypertension Father   . COPD Father   . Cancer Paternal Aunt 28       breast cancer  . Cancer Paternal Aunt 37       breast cancer  . Cancer Cousin 50       breast cancer  . Cancer Cousin  breast cancer  . Cancer Other 35       breast cancer  . Colon cancer Neg Hx   . Rectal cancer Neg Hx   . Stomach cancer Neg Hx     Social History Social History   Tobacco Use  . Smoking status: Never Smoker  . Smokeless tobacco: Never Used  Substance Use Topics  . Alcohol use: No  . Drug use: Never     Allergies   Other, Erythromycin, Shellfish allergy, and Shellfish-derived products   Review of Systems Review of Systems  Constitutional: Negative for chills and fever.  Respiratory: Negative for shortness of breath.   Cardiovascular: Negative for chest pain, palpitations and leg swelling.  Musculoskeletal: Negative for joint swelling and myalgias.  Skin: Positive for color change. Negative for wound.     Physical Exam Triage Vital Signs ED Triage Vitals  Enc Vitals Group     BP 05/19/19  0841 115/77     Pulse Rate 05/19/19 0841 92     Resp 05/19/19 0841 16     Temp 05/19/19 0841 97.9 F (36.6 C)     Temp Source 05/19/19 0841 Oral     SpO2 05/19/19 0841 100 %     Weight 05/19/19 0842 137 lb (62.1 kg)     Height 05/19/19 0842 5' 5.5" (1.664 m)     Head Circumference --      Peak Flow --      Pain Score 05/19/19 0842 0     Pain Loc --      Pain Edu? --      Excl. in [place]? --    No data found.  Updated Vital Signs BP 115/77 (BP Location: Right Arm)   Pulse 92   Temp 97.9 F (36.6 C) (Oral)   Resp 16   Ht 5' 5.5" (1.664 m)   Wt 137 lb (62.1 kg)   SpO2 100%   BMI 22.45 kg/m     Physical Exam Vitals and nursing note reviewed.  Constitutional:      Appearance: Normal appearance. She is well-developed.  HENT:     Head: Normocephalic and atraumatic.  Cardiovascular:     Rate and Rhythm: Normal rate.  Pulmonary:     Effort: Pulmonary effort is normal.  Musculoskeletal:        General: No swelling or tenderness. Normal range of motion.     Cervical back: Normal range of motion.  Skin:    General: Skin is warm and dry.     Findings: Bruising present. No erythema.       Neurological:     Mental Status: She is alert and oriented to person, place, and time.  Psychiatric:        Behavior: Behavior normal.      UC Treatments / Results  Labs (all labs ordered are listed, but only abnormal results are displayed) Labs Reviewed - No data to display  EKG   Radiology No results found.  Procedures Procedures (including critical care time)  Medications Ordered in UC Medications - No data to display  Initial Impression / Assessment and Plan / UC Course  I have reviewed the triage vital signs and the nursing notes.  Pertinent labs & imaging results that were available during my care of the patient were reviewed by me and considered in my medical decision making (see chart for details).    Pt sent to [facility] for venous US to r/o DVT  Reviewed CBC from last  month, platelets were WNL at 215. Without any recent medication changes or reports of easy bleeding or other bruising, will hold off on rechecking CBC at this time.  Encouraged f/u with her PCP  AVS provided  Will call pt with her [place] results.   Final Clinical Impressions(s) / UC Diagnoses   Final diagnoses:  Contusion of right lower extremity, initial encounter     Discharge Instructions      Because we do not have ultrasound available at this facility in the evenings or on weekends, your test has been ordered to be performed at our sister site, [facility]. They are expecting you.  Please go through the Emergency Department entrance, let them know you are there for an outpatient ultrasound. They will direct you to the imaging department for your ultrasound.  You may wait in the waiting room for your results. If you have to leave before the results come back, please leave a good contact phone number where we can reach you.     ED Prescriptions    None     PDMP not reviewed this encounter.   Noe Gens, [place] 05/19/19 [phone]

## 2019-05-21 ENCOUNTER — Encounter: Payer: Self-pay | Admitting: Gastroenterology

## 2019-05-22 ENCOUNTER — Encounter: Payer: Self-pay | Admitting: Gastroenterology

## 2019-05-22 ENCOUNTER — Other Ambulatory Visit: Payer: Self-pay

## 2019-05-22 ENCOUNTER — Ambulatory Visit (AMBULATORY_SURGERY_CENTER): Payer: PRIVATE HEALTH INSURANCE | Admitting: Gastroenterology

## 2019-05-22 VITALS — BP 120/56 | HR 74 | Temp 97.7°F | Resp 19 | Ht 65.0 in | Wt 137.0 lb

## 2019-05-22 DIAGNOSIS — Z1211 Encounter for screening for malignant neoplasm of colon: Secondary | ICD-10-CM | POA: Diagnosis present

## 2019-05-22 DIAGNOSIS — D125 Benign neoplasm of sigmoid colon: Secondary | ICD-10-CM | POA: Diagnosis not present

## 2019-05-22 DIAGNOSIS — K635 Polyp of colon: Secondary | ICD-10-CM | POA: Diagnosis not present

## 2019-05-22 MED ORDER — SODIUM CHLORIDE 0.9 % IV SOLN: 500.0000 mL | Freq: Once | INTRAVENOUS | Status: DC

## 2019-05-22 NOTE — Progress Notes (Signed)
Pt's states no medical or surgical changes since previsit or office visit.  CW vitals, SM IV and LC temp.

## 2019-05-22 NOTE — Progress Notes (Signed)
Called to room to assist during endoscopic procedure.  Patient ID and intended procedure confirmed with present staff. Received instructions for my participation in the procedure from the performing physician.  

## 2019-05-22 NOTE — Progress Notes (Signed)
A/ox3, pleased with MAC, report to RN 

## 2019-05-22 NOTE — Op Note (Signed)
[facility] Patient Name: Sheri [name] Procedure Date: 05/22/2019 11:40 AM MRN: [id number] Endoscopist: Gerrit Heck , MD Age: 49 Referring MD:  Date of Birth: [birth year] Gender: Female Account #: [id number] Procedure:                Colonoscopy Indications:              Screening for colorectal malignant neoplasm, This                            is the patient's first colonoscopy Medicines:                Monitored Anesthesia Care Procedure:                Pre-Anesthesia Assessment:                           - Prior to the procedure, a History and Physical                            was performed, and patient medications and                            allergies were reviewed. The patient's tolerance of                            previous anesthesia was also reviewed. The risks                            and benefits of the procedure and the sedation                            options and risks were discussed with the patient.                            All questions were answered, and informed consent                            was obtained. Prior Anticoagulants: The patient has                            taken no previous anticoagulant or antiplatelet                            agents. ASA Grade Assessment: II - A patient with                            mild systemic disease. After reviewing the risks                            and benefits, the patient was deemed in                            satisfactory condition to undergo the procedure.  After obtaining informed consent, the colonoscope                            was passed under direct vision. Throughout the                            procedure, the patient's blood pressure, pulse, and                            oxygen saturations were monitored continuously. The                            Colonoscope was introduced through the anus and                            advanced to the the  cecum, identified by                            appendiceal orifice and ileocecal valve. The                            colonoscopy was performed without difficulty. The                            patient tolerated the procedure well. The quality                            of the bowel preparation was adequate. The                            ileocecal valve, appendiceal orifice, and rectum                            were photographed. Scope In: 11:46:24 AM Scope Out: [id number] PM Scope Withdrawal Time: 0 hours 17 minutes 19 seconds  Total Procedure Duration: 0 hours 20 minutes 30 seconds  Findings:                 The perianal and digital rectal examinations were                            normal.                           Four sessile polyps were found in the sigmoid                            colon. The polyps were 2 to 4 mm in size. These                            polyps were removed with a cold snare. Resection                            and retrieval were complete. Estimated blood loss  was minimal. Estimated blood loss was minimal.                           The exam was otherwise normal throughout the                            remainder of the colon.                           The retroflexed view of the distal rectum and anal                            verge was normal and showed no anal or rectal                            abnormalities. Complications:            No immediate complications. Estimated Blood Loss:     Estimated blood loss was minimal. Impression:               - Four 2 to 4 mm polyps in the sigmoid colon,                            removed with a cold snare. Resected and retrieved.                           - The distal rectum and anal verge are normal on                            retroflexion view. Recommendation:           - Patient has a contact number available for                            emergencies. The signs and symptoms of  potential                            delayed complications were discussed with the                            patient. Return to normal activities tomorrow.                            Written discharge instructions were provided to the                            patient.                           - Resume previous diet.                           - Continue present medications.                           - Await pathology results.                           -  Repeat colonoscopy for surveillance based on                            pathology results.                           - Return to GI office PRN. Gerrit Heck, MD 05/22/2019 12:09:47 PM

## 2019-05-22 NOTE — Patient Instructions (Signed)
Impression/Recommendations:  [place] handout given to patient.  Resume previous diet. Continue present medications. Await pathology results.  Repeat colonoscopy for surveillance.  Date to be determined based on pathology results.  Return to GI office as needed.  YOU HAD AN ENDOSCOPIC PROCEDURE TODAY AT THE [facility]:   Refer to the procedure report that was given to you for any specific questions about what was found during the examination.  If the procedure report does not answer your questions, please call your gastroenterologist to clarify.  If you requested that your care partner not be given the details of your procedure findings, then the procedure report has been included in a sealed envelope for you to review at your convenience later.  YOU SHOULD EXPECT: Some feelings of bloating in the abdomen. Passage of more gas than usual.  Walking can help get rid of the air that was put into your GI tract during the procedure and reduce the bloating. If you had a lower endoscopy (such as a colonoscopy or flexible sigmoidoscopy) you may notice spotting of blood in your stool or on the toilet paper. If you underwent a bowel prep for your procedure, you may not have a normal bowel movement for a few days.  Please Note:  You might notice some irritation and congestion in your nose or some drainage.  This is from the oxygen used during your procedure.  There is no need for concern and it should clear up in a day or so.  SYMPTOMS TO REPORT IMMEDIATELY:   Following lower endoscopy (colonoscopy or flexible sigmoidoscopy):  Excessive amounts of blood in the stool  Significant tenderness or worsening of abdominal pains  Swelling of the abdomen that is new, acute  Fever of 100F or higher For urgent or emergent issues, a gastroenterologist can be reached at any hour by calling [phone]. Do not use MyChart messaging for urgent concerns.    DIET:  We do recommend a small meal at  first, but then you may proceed to your regular diet.  Drink plenty of fluids but you should avoid alcoholic beverages for 24 hours.  ACTIVITY:  You should plan to take it easy for the rest of today and you should NOT DRIVE or use heavy machinery until tomorrow (because of the sedation medicines used during the test).    FOLLOW UP: Our staff will call the number listed on your records 48-72 hours following your procedure to check on you and address any questions or concerns that you may have regarding the information given to you following your procedure. If we do not reach you, we will leave a message.  We will attempt to reach you two times.  During this call, we will ask if you have developed any symptoms of COVID 19. If you develop any symptoms (ie: fever, flu-like symptoms, shortness of breath, cough etc.) before then, please call [phone].  If you test positive for Covid 19 in the 2 weeks post procedure, please call and report this information to us.    If any biopsies were taken you will be contacted by phone or by letter within the next 1-3 weeks.  Please call us at [phone] if you have not heard about the biopsies in 3 weeks.    SIGNATURES/CONFIDENTIALITY: You and/or your care partner have signed paperwork which will be entered into your electronic medical record.  These signatures attest to the fact that that the information above on your After Visit Summary has been reviewed   and is understood.  Full responsibility of the confidentiality of this discharge information lies with you and/or your care-partner. 

## 2019-05-24 ENCOUNTER — Telehealth: Payer: Self-pay

## 2019-05-24 NOTE — Telephone Encounter (Signed)
  Follow up Call-  Call back number 05/22/2019  Post procedure Call Back phone  # [phone]  Permission to leave phone message Yes  Some recent data might be hidden     Patient questions:  Do you have a fever, pain , or abdominal swelling? No. Pain Score  0 *  Have you tolerated food without any problems? Yes.    Have you been able to return to your normal activities? Yes.    Do you have any questions about your discharge instructions: Diet   No. Medications  No. Follow up visit  No.  Do you have questions or concerns about your Care? No.  Actions: * If pain score is 4 or above: No action needed, pain <4. 1. Have you developed a fever since your procedure? no  2.   Have you had an respiratory symptoms (SOB or cough) since your procedure? no  3.   Have you tested positive for COVID 19 since your procedure no  4.   Have you had any family members/close contacts diagnosed with the COVID 19 since your procedure?  no   If yes to any of these questions please route to Joylene John, RN and Erenest Rasher, RN

## 2019-06-02 ENCOUNTER — Encounter: Payer: Self-pay | Admitting: Gastroenterology

## 2019-06-06 ENCOUNTER — Other Ambulatory Visit: Payer: Self-pay | Admitting: Hematology

## 2019-08-23 IMAGING — MG NEEDLE LOCALIZATION OF THE LEFT BREAST WITH MAMMO GUIDANCE
3 series · 3 of 3 positions shown · non-contrast
Comparison: Previous exam(s).

CLINICAL DATA: Preoperative radioactive seed localization, prior to
left breast excisional biopsy.

EXAM:
ULTRASOUND GUIDED RADIOACTIVE SEED LOCALIZATION OF THE LEFT BREAST

[L CC (1 of 2)]
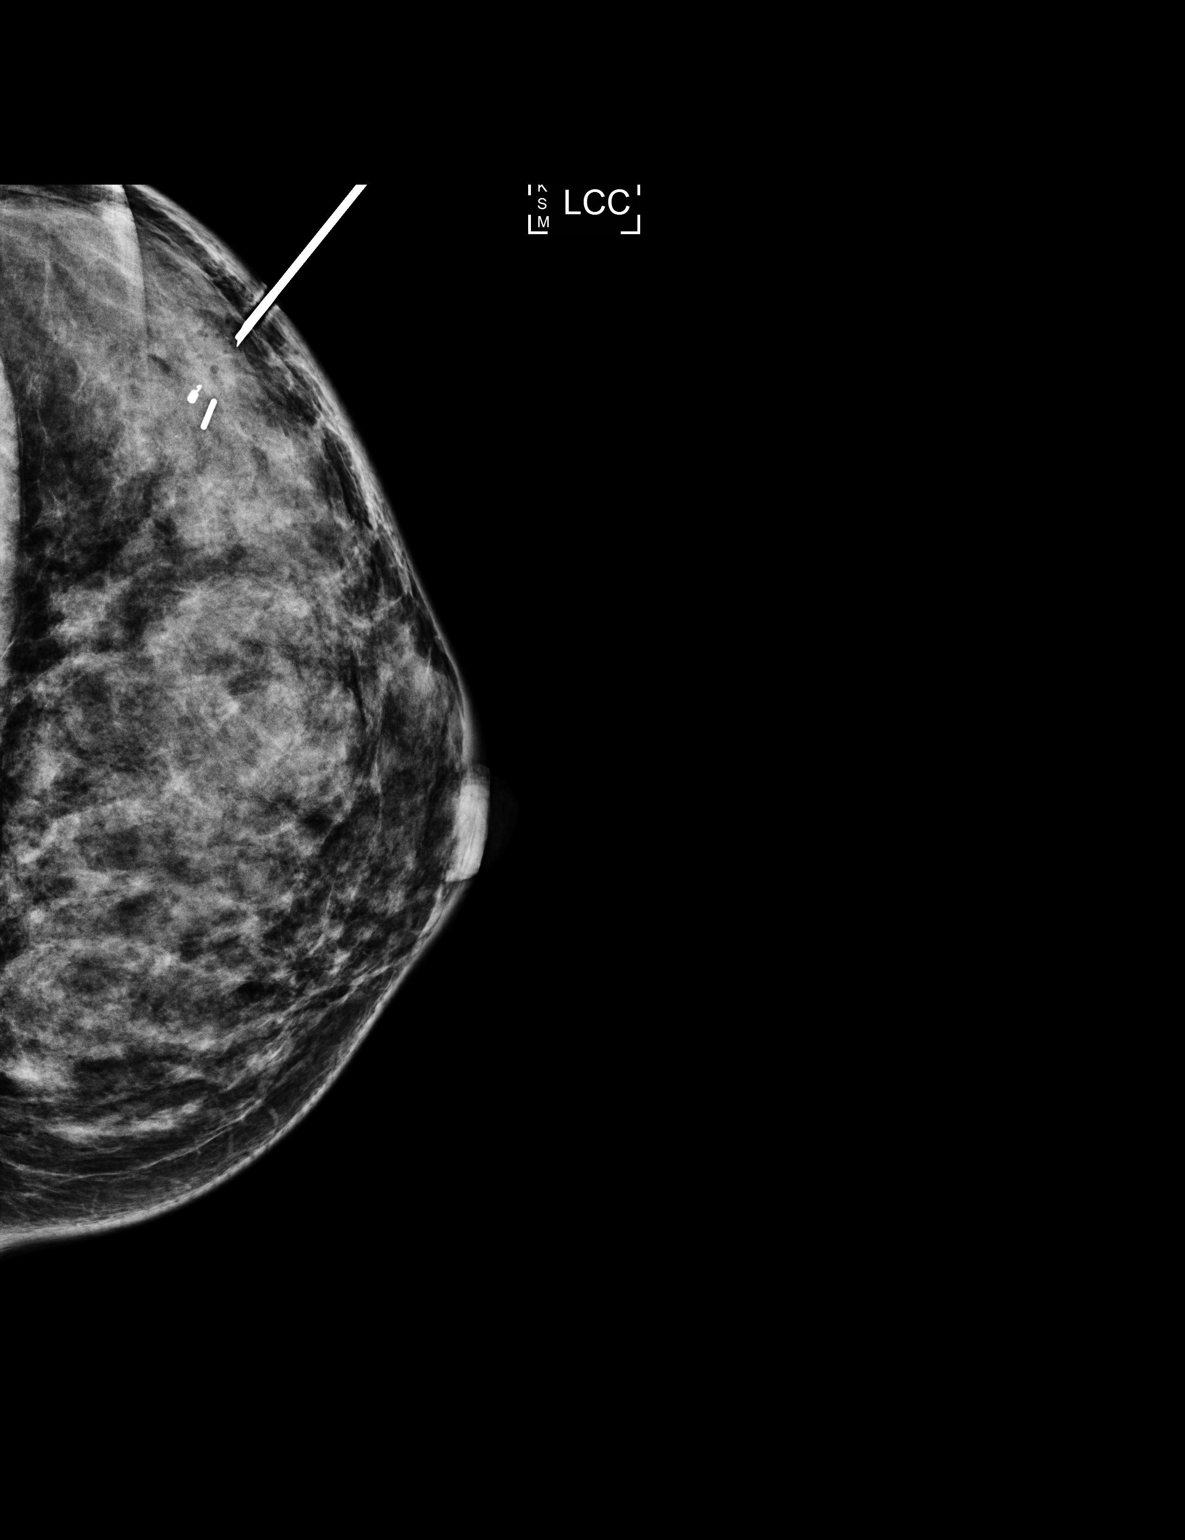

[L LM]
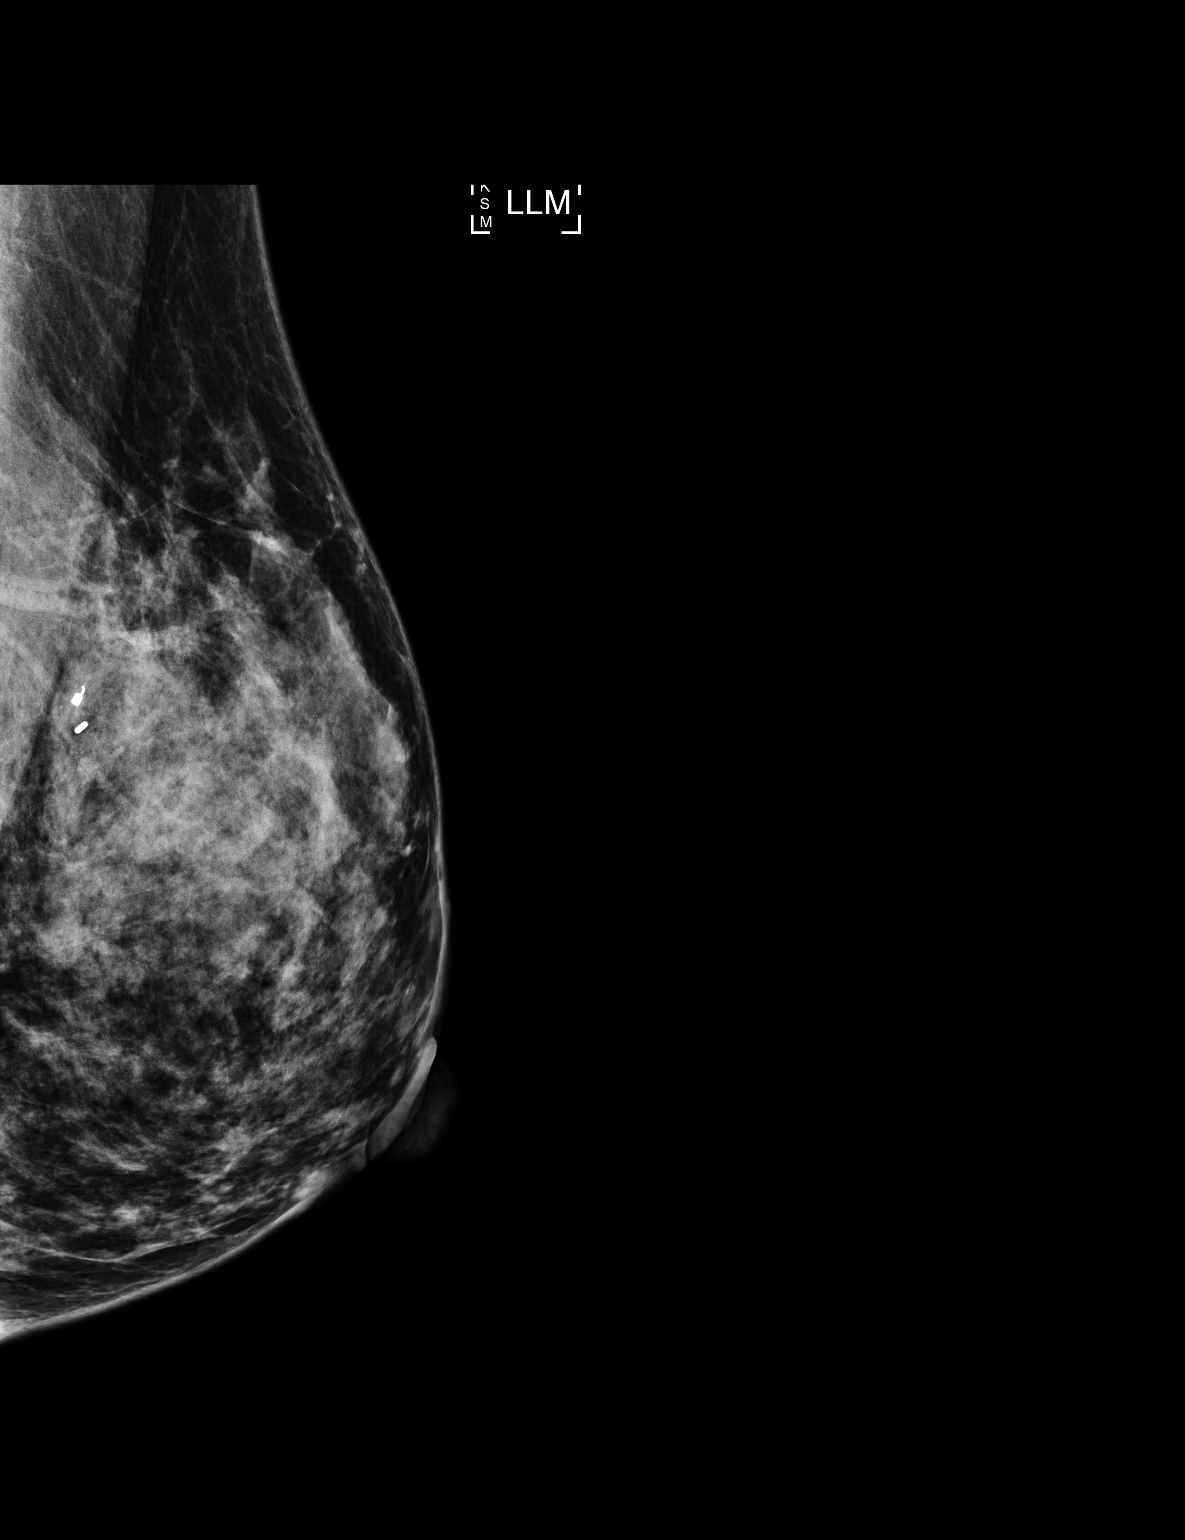

[L CC (2 of 2)]
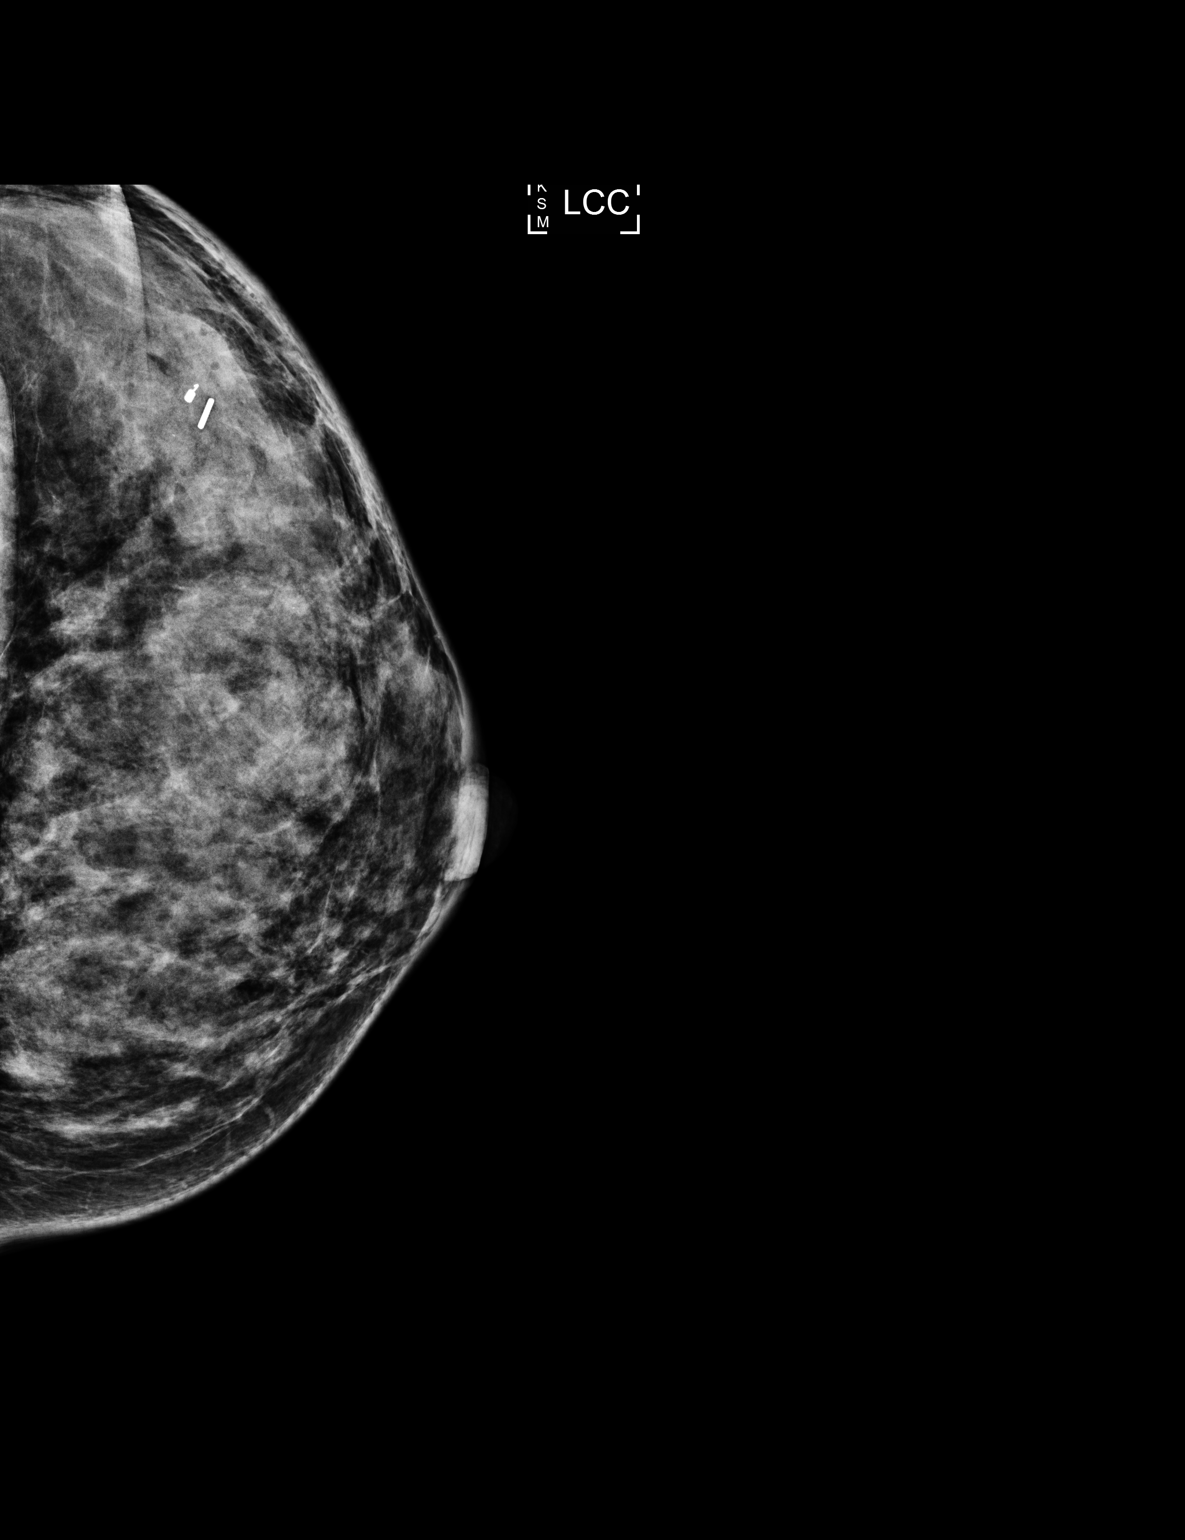

[3 of 3 positions shown; findings below may reference images not displayed]

FINDINGS: Patient presents for radioactive seed localization prior to left
breast excisional biopsy. I met with the patient and we discussed
the procedure of seed localization including benefits and
alternatives. We discussed the high likelihood of a successful
procedure. We discussed the risks of the procedure including
infection, bleeding, tissue injury and further surgery. We discussed
the low dose of radioactivity involved in the procedure. Informed,
written consent was given.

The usual time-out protocol was performed immediately prior to the
procedure.

Using ultrasound guidance, sterile technique, 1% lidocaine and an
J-IDX radioactive seed, left breast upper outer quadrant coil shaped
marker and adjacent residual calcifications were localized using a
lateral approach. The follow-up mammogram images confirm the seed in
the expected location and were marked for Dr. Dleke.

Follow-up survey of the patient confirms presence of the radioactive
seed.

Order number of J-IDX seed:  [id number].

Total activity:  0.248 millicurie reference Date: September 08, 2017

The patient tolerated the procedure well and was released from the
[REDACTED]. She was given instructions regarding seed removal.
IMPRESSION: Radioactive seed localization left breast. No apparent
complications.

## 2019-08-26 DIAGNOSIS — Z6823 Body mass index (BMI) 23.0-23.9, adult: Secondary | ICD-10-CM | POA: Diagnosis not present

## 2019-08-26 DIAGNOSIS — Z01419 Encounter for gynecological examination (general) (routine) without abnormal findings: Secondary | ICD-10-CM | POA: Diagnosis not present

## 2019-08-26 DIAGNOSIS — Z1231 Encounter for screening mammogram for malignant neoplasm of breast: Secondary | ICD-10-CM | POA: Diagnosis not present

## 2019-09-26 DIAGNOSIS — M9904 Segmental and somatic dysfunction of sacral region: Secondary | ICD-10-CM | POA: Diagnosis not present

## 2019-09-26 DIAGNOSIS — M9903 Segmental and somatic dysfunction of lumbar region: Secondary | ICD-10-CM | POA: Diagnosis not present

## 2019-09-26 DIAGNOSIS — M9901 Segmental and somatic dysfunction of cervical region: Secondary | ICD-10-CM | POA: Diagnosis not present

## 2019-09-26 DIAGNOSIS — M9902 Segmental and somatic dysfunction of thoracic region: Secondary | ICD-10-CM | POA: Diagnosis not present

## 2019-10-24 DIAGNOSIS — M9903 Segmental and somatic dysfunction of lumbar region: Secondary | ICD-10-CM | POA: Diagnosis not present

## 2019-10-24 DIAGNOSIS — M9902 Segmental and somatic dysfunction of thoracic region: Secondary | ICD-10-CM | POA: Diagnosis not present

## 2019-10-24 DIAGNOSIS — M9904 Segmental and somatic dysfunction of sacral region: Secondary | ICD-10-CM | POA: Diagnosis not present

## 2019-10-24 DIAGNOSIS — M9901 Segmental and somatic dysfunction of cervical region: Secondary | ICD-10-CM | POA: Diagnosis not present

## 2019-11-22 ENCOUNTER — Ambulatory Visit: Payer: PRIVATE HEALTH INSURANCE | Admitting: Physician Assistant

## 2019-11-24 ENCOUNTER — Other Ambulatory Visit: Payer: Self-pay | Admitting: Osteopathic Medicine

## 2019-12-05 DIAGNOSIS — M9904 Segmental and somatic dysfunction of sacral region: Secondary | ICD-10-CM | POA: Diagnosis not present

## 2019-12-05 DIAGNOSIS — M9901 Segmental and somatic dysfunction of cervical region: Secondary | ICD-10-CM | POA: Diagnosis not present

## 2019-12-05 DIAGNOSIS — M9902 Segmental and somatic dysfunction of thoracic region: Secondary | ICD-10-CM | POA: Diagnosis not present

## 2019-12-05 DIAGNOSIS — M9903 Segmental and somatic dysfunction of lumbar region: Secondary | ICD-10-CM | POA: Diagnosis not present

## 2019-12-16 ENCOUNTER — Telehealth: Payer: Self-pay | Admitting: *Deleted

## 2019-12-16 DIAGNOSIS — E039 Hypothyroidism, unspecified: Secondary | ICD-10-CM

## 2019-12-16 NOTE — Telephone Encounter (Signed)
TSH ordered per pt request.  Pt also has an upcoming appointment with PCP.

## 2019-12-18 DIAGNOSIS — E039 Hypothyroidism, unspecified: Secondary | ICD-10-CM | POA: Diagnosis not present

## 2019-12-18 LAB — TSH: TSH: 1.02 mIU/L

## 2019-12-19 ENCOUNTER — Encounter: Payer: Self-pay | Admitting: Osteopathic Medicine

## 2019-12-19 ENCOUNTER — Other Ambulatory Visit: Payer: Self-pay | Admitting: Osteopathic Medicine

## 2019-12-19 MED ORDER — LEVOTHYROXINE SODIUM 88 MCG PO TABS: 88.0000 ug | ORAL_TABLET | Freq: Every day | ORAL | 3 refills | Status: DC

## 2019-12-19 NOTE — Telephone Encounter (Signed)
1 day   8 months TSH mIU/L 1.02  3.10 CM

## 2019-12-24 ENCOUNTER — Ambulatory Visit: Payer: PRIVATE HEALTH INSURANCE | Admitting: Osteopathic Medicine

## 2019-12-24 ENCOUNTER — Telehealth: Payer: Self-pay | Admitting: Osteopathic Medicine

## 2019-12-24 ENCOUNTER — Encounter: Payer: Self-pay | Admitting: Osteopathic Medicine

## 2019-12-24 MED ORDER — SYNTHROID 88 MCG PO TABS: 88.0000 ug | ORAL_TABLET | Freq: Every day | ORAL | 3 refills | Status: DC

## 2019-12-24 NOTE — Telephone Encounter (Signed)
Pt called at 854 to cancel appt. She stated that provider helped thru mychart, therefore she didn't feel the need for the appt.

## 2020-01-07 DIAGNOSIS — M9903 Segmental and somatic dysfunction of lumbar region: Secondary | ICD-10-CM | POA: Diagnosis not present

## 2020-01-07 DIAGNOSIS — M9904 Segmental and somatic dysfunction of sacral region: Secondary | ICD-10-CM | POA: Diagnosis not present

## 2020-01-07 DIAGNOSIS — M9902 Segmental and somatic dysfunction of thoracic region: Secondary | ICD-10-CM | POA: Diagnosis not present

## 2020-01-07 DIAGNOSIS — M9901 Segmental and somatic dysfunction of cervical region: Secondary | ICD-10-CM | POA: Diagnosis not present

## 2020-01-21 DIAGNOSIS — M9901 Segmental and somatic dysfunction of cervical region: Secondary | ICD-10-CM | POA: Diagnosis not present

## 2020-01-21 DIAGNOSIS — M9904 Segmental and somatic dysfunction of sacral region: Secondary | ICD-10-CM | POA: Diagnosis not present

## 2020-01-21 DIAGNOSIS — M9903 Segmental and somatic dysfunction of lumbar region: Secondary | ICD-10-CM | POA: Diagnosis not present

## 2020-01-21 DIAGNOSIS — M9902 Segmental and somatic dysfunction of thoracic region: Secondary | ICD-10-CM | POA: Diagnosis not present

## 2020-03-03 NOTE — Telephone Encounter (Signed)
Note completed 

## 2020-03-16 DIAGNOSIS — K602 Anal fissure, unspecified: Secondary | ICD-10-CM | POA: Diagnosis not present

## 2020-03-26 DIAGNOSIS — M9904 Segmental and somatic dysfunction of sacral region: Secondary | ICD-10-CM | POA: Diagnosis not present

## 2020-03-26 DIAGNOSIS — M9902 Segmental and somatic dysfunction of thoracic region: Secondary | ICD-10-CM | POA: Diagnosis not present

## 2020-03-26 DIAGNOSIS — M9901 Segmental and somatic dysfunction of cervical region: Secondary | ICD-10-CM | POA: Diagnosis not present

## 2020-03-26 DIAGNOSIS — M9903 Segmental and somatic dysfunction of lumbar region: Secondary | ICD-10-CM | POA: Diagnosis not present

## 2020-04-13 NOTE — Progress Notes (Signed)
Sheri [name]   Telephone:([phone]) [phone] Fax:(336) [phone]   Clinic Follow up Note   Patient Care Team: Sheri Reeve, DO as PCP - General (Osteopathic Medicine) Sheri Keens, MD as Consulting Physician (General Surgery) Sheri Merle, MD as Consulting Physician (Hematology)  Date of Service:  04/16/2020  CHIEF COMPLAINT: F/u onlobular carcinoma in [place] left breast  SUMMARY OF ONCOLOGIC HISTORY: Oncology History  Lobular carcinoma in situ (LCIS) of left breast  08/15/2017 Initial Biopsy   Diagnosis Breast, left, needle core biopsy, upper outer quadrant - LOBULAR NEOPLASIA (ATYPICAL LOBULAR HYPERPLASIA). - FIBROCYSTIC CHANGE AND ADENOSIS WITH CALCIFICATIONS. - PSEUDOANGIOMATOUS STROMAL HYPERPLASIA ([place]).   09/15/2017 Pathology Results   Diagnosis Breast, lumpectomy, left - LOBULAR NEOPLASIA (LOBULAR CARCINOMA IN SITU). - HEALING BIOPSY SITE. - SEE COMMENT.   10/07/2017 Initial Diagnosis   Lobular carcinoma in situ (LCIS) of left breast   10/10/2018 -  Anti-estrogen oral therapy   Tamoxifen started 10/09/2017      CURRENT THERAPY:  Tamoxifen20mg  dailystarted 10/09/2017  INTERVAL HISTORY:  Sheri [name] is here for a follow up of Left breast LCIS. She was last seen by me 1 year ago. She presents to the clinic alone. She is doing well, tolerating tamoxifne fine, no noticeable side effect. Period stil regular but heavier than before.  No fatigue or dyspnea.  She wanted to donate blood last year but was told her iron level was low and could not.  No pain or other complaints.  She has normal appetite and energy level, weight is stable.  All other systems were reviewed with the patient and are negative.  MEDICAL HISTORY:  Past Medical History:  Diagnosis Date   Allergy    Breast mass, left    DCIS (ductal carcinoma in situ)    left breast   Hypothyroidism    Seasonal allergies    Sebaceous cyst    right buttock   Thyroid disease      SURGICAL HISTORY: Past Surgical History:  Procedure Laterality Date   BREAST BIOPSY     BREAST EXCISIONAL BIOPSY     BREAST LUMPECTOMY WITH RADIOACTIVE SEED LOCALIZATION Left 09/15/2017   Procedure: BREAST LUMPECTOMY WITH RADIOACTIVE SEED LOCALIZATION;  Surgeon: Sheri Keens, MD;  Location: [facility];  Service: General;  Laterality: Left;   CYST EXCISION Right 09/15/2017   Procedure: EXCISION OF 1CM RIGHT BUTTOCK SEBACEOUS CYST;  Surgeon: Sheri Keens, MD;  Location: [facility];  Service: General;  Laterality: Right;   WISDOM TOOTH EXTRACTION      I have reviewed the social history and family history with the patient and they are unchanged from previous note.  ALLERGIES:  is allergic to other, erythromycin, shellfish allergy, and shellfish-derived products.  MEDICATIONS:  Current Outpatient Medications  Medication Sig Dispense Refill   BIOTIN PO Take by mouth daily.     levocetirizine (XYZAL) 5 MG tablet Take 1 tablet (5 mg total) by mouth every evening. 90 tablet 3   Multiple Vitamin (MULTIVITAMIN) tablet Take 1 tablet by mouth daily.     SYNTHROID 88 MCG tablet Take 1 tablet (88 mcg total) by mouth daily. 90 tablet 3   tamoxifen (NOLVADEX) 20 MG tablet TAKE 1 TABLET BY MOUTH EVERY DAY 90 tablet 3   No current facility-administered medications for this visit.    PHYSICAL EXAMINATION: ECOG PERFORMANCE STATUS: 0 - Asymptomatic  Vitals:   04/16/20 1408  BP: 126/75  Pulse: 73  Resp: 18  Temp: (!) 97.4 F (36.3  C)  SpO2: 100%   Filed Weights   04/16/20 1408  Weight: 138 lb 11.2 oz (62.9 kg)    GENERAL:alert, no distress and comfortable SKIN: skin color, texture, turgor are normal, no rashes or significant lesions EYES: normal, Conjunctiva are pink and non-injected, sclera clear NECK: supple, thyroid normal size, non-tender, without nodularity LYMPH:  no palpable lymphadenopathy in the cervical, axillary  LUNGS: clear  to auscultation and percussion with normal breathing effort HEART: regular rate & rhythm and no murmurs and no lower extremity edema ABDOMEN:abdomen soft, non-tender and normal bowel sounds Musculoskeletal:no cyanosis of digits and no clubbing  NEURO: alert & oriented x 3 with fluent speech, no focal motor/sensory deficits Breasts: Breast inspection showed them to be symmetrical with no nipple discharge. Palpation of the breasts and axilla revealed no obvious mass that I could appreciate. Incision in laterla left breast healed well    LABORATORY DATA:  I have reviewed the data as listed CBC Latest Ref Rng & Units 04/16/2020 04/17/2019 04/17/2019  WBC 4.0 - 10.5 K/uL 4.5 4.9 4.1  Hemoglobin 12.0 - 15.0 g/dL 11.9(L) 11.9(L) 12.3  Hematocrit 36.0 - 46.0 % 36.3 36.7 37.1  Platelets 150 - 400 K/uL 192 215 209     CMP Latest Ref Rng & Units 04/16/2020 04/17/2019 04/17/2019  Glucose 70 - 99 mg/dL 80 100(H) 87  BUN 6 - 20 mg/dL 10 13 13   Creatinine 0.44 - 1.00 mg/dL 0.73 0.77 0.69  Sodium 135 - 145 mmol/L 136 139 140  Potassium 3.5 - 5.1 mmol/L 3.4(L) 3.9 4.2  Chloride 98 - 111 mmol/L 106 109 109  CO2 22 - 32 mmol/L 25 25 27   Calcium 8.9 - 10.3 mg/dL 8.7(L) 8.3(L) 9.1  Total Protein 6.5 - 8.1 g/dL 6.6 6.3(L) 6.3  Total Bilirubin 0.3 - 1.2 mg/dL 0.5 0.3 0.5  Alkaline Phos 38 - 126 U/L 54 56 -  AST 15 - 41 U/L 19 23 25   ALT 0 - 44 U/L 17 14 16     Colonoscopy by Dr Sheri [name] 05/22/19 IMPRESSION - Four 2 to 4 mm polyps in the sigmoid colon, removed with a cold snare. Resected and retrieved. - The distal rectum and anal verge are normal on retroflexion view. Diagnosis Surgical [P], colon, sigmoid, polyp (3) - HYPERPLASTIC POLYP - OTHER FRAGMENT OF POLYPOID COLONIC MUCOSA WITH NO SPECIFIC HISTOPATHOLOGIC CHANGES - MULTIPLE STEP SECTIONS WERE EXAMINED    RADIOGRAPHIC STUDIES: I have personally reviewed the radiological images as listed and agreed with the findings in the report. No  results found.   ASSESSMENT & PLAN:  Sheri [name] is a 50 y.o. female with    1. Lobularcarcinomainsituof left breast -Diagnosed in 10/2017. Treated withleftlumpectomy. Currently on Tamoxifento reduce her future risk of breast cancer.Tolerating well with minimal side effects. -She notes having irregular periods, little heavier than before, again reviewed risk of endometrial cancer from Tamoxifen and watch for vaginal bleeding.  -Her 08/2018 Mammogram was benign but her 04/12/19 MRI breast showed Indeterminate linear non mass enhancement in left breast. Left breast biopsy from 04/26/19 was negative for malignancy.  -She is clinically doing well. Lab reviewed, her CBC and CMP are within normal limits. Her physical exam was unremarkable. There is no clinical concern for recurrence. -she is due to breast MRI this month, order placed -She will continue annual mammogram at her gynecologist office, due in July or August of this year -Continue tamoxifen, lab and follow-up in one year  2. Iron deficiency -Likely related to  her heavy menstrual. She has mild anemia  -I recommended her to start oral iron, she agrees -We will repeat ferritin on next visit  Plan -continue tamoxifen -lab andf/u in 1 year -Breast MRI for cancer screening in 2 to 4 weeks -Mammogram in 07/2020 at her GYN office    No problem-specific Assessment & Plan notes found for this encounter.   Orders Placed This Encounter  Procedures   MR BREAST BILATERAL W WO CONTRAST INC CAD    Standing Status:   Future    Standing Expiration Date:   04/16/2021    Order Specific Question:   If indicated for the ordered procedure, I authorize the administration of contrast media per Radiology protocol    Answer:   Yes    Order Specific Question:   What is the patient's sedation requirement?    Answer:   No Sedation    Order Specific Question:   Does the patient have a pacemaker or implanted devices?    Answer:   No     Order Specific Question:   Radiology Contrast Protocol - do NOT remove file path    Answer:   [email or web address]    Order Specific Question:   Preferred imaging location?    Answer:   [facility] (table limit-550lbs)   Ferritin    Standing Status:   Future    Standing Expiration Date:   04/16/2021   All questions were answered. The patient knows to call the clinic with any problems, questions or concerns. No barriers to learning was detected. The total time spent in the appointment was 25 minutes.     Sheri Merle, MD 04/16/2020   I, Joslyn Devon, am acting as scribe for Sheri Merle, MD.   I have reviewed the above documentation for accuracy and completeness, and I agree with the above.

## 2020-04-16 ENCOUNTER — Other Ambulatory Visit: Payer: Self-pay

## 2020-04-16 ENCOUNTER — Inpatient Hospital Stay: Payer: BC Managed Care – PPO

## 2020-04-16 ENCOUNTER — Inpatient Hospital Stay: Payer: BC Managed Care – PPO | Attending: Hematology | Admitting: Hematology

## 2020-04-16 ENCOUNTER — Encounter: Payer: Self-pay | Admitting: Hematology

## 2020-04-16 VITALS — BP 126/75 | HR 73 | Temp 97.4°F | Resp 18 | Ht 65.0 in | Wt 138.7 lb

## 2020-04-16 DIAGNOSIS — E611 Iron deficiency: Secondary | ICD-10-CM | POA: Diagnosis not present

## 2020-04-16 DIAGNOSIS — D649 Anemia, unspecified: Secondary | ICD-10-CM | POA: Insufficient documentation

## 2020-04-16 DIAGNOSIS — D500 Iron deficiency anemia secondary to blood loss (chronic): Secondary | ICD-10-CM

## 2020-04-16 DIAGNOSIS — D0502 Lobular carcinoma in situ of left breast: Secondary | ICD-10-CM

## 2020-04-16 LAB — CBC WITH DIFFERENTIAL (CANCER CENTER ONLY)
Abs Immature Granulocytes: 0.01 10*3/uL (ref 0.00–0.07)
Basophils Absolute: 0 10*3/uL (ref 0.0–0.1)
Basophils Relative: 0 %
Eosinophils Absolute: 0 10*3/uL (ref 0.0–0.5)
Eosinophils Relative: 1 %
HCT: 36.3 % (ref 36.0–46.0)
Hemoglobin: 11.9 g/dL — ABNORMAL LOW (ref 12.0–15.0)
Immature Granulocytes: 0 %
Lymphocytes Relative: 51 %
Lymphs Abs: 2.3 10*3/uL (ref 0.7–4.0)
MCH: 28.6 pg (ref 26.0–34.0)
MCHC: 32.8 g/dL (ref 30.0–36.0)
MCV: 87.3 fL (ref 80.0–100.0)
Monocytes Absolute: 0.2 10*3/uL (ref 0.1–1.0)
Monocytes Relative: 5 %
Neutro Abs: 1.9 10*3/uL (ref 1.7–7.7)
Neutrophils Relative %: 43 %
Platelet Count: 192 10*3/uL (ref 150–400)
RBC: 4.16 MIL/uL (ref 3.87–5.11)
RDW: 13.5 % (ref 11.5–15.5)
WBC Count: 4.5 10*3/uL (ref 4.0–10.5)
nRBC: 0 % (ref 0.0–0.2)

## 2020-04-16 LAB — CMP (CANCER CENTER ONLY)
ALT: 17 U/L (ref 0–44)
AST: 19 U/L (ref 15–41)
Albumin: 3.7 g/dL (ref 3.5–5.0)
Alkaline Phosphatase: 54 U/L (ref 38–126)
Anion gap: 5 (ref 5–15)
BUN: 10 mg/dL (ref 6–20)
CO2: 25 mmol/L (ref 22–32)
Calcium: 8.7 mg/dL — ABNORMAL LOW (ref 8.9–10.3)
Chloride: 106 mmol/L (ref 98–111)
Creatinine: 0.73 mg/dL (ref 0.44–1.00)
GFR, Estimated: >60 mL/min (ref >60)
Glucose, Bld: 80 mg/dL (ref 70–99)
Potassium: 3.4 mmol/L — ABNORMAL LOW (ref 3.5–5.1)
Sodium: 136 mmol/L (ref 135–145)
Total Bilirubin: 0.5 mg/dL (ref 0.3–1.2)
Total Protein: 6.6 g/dL (ref 6.5–8.1)

## 2020-04-28 ENCOUNTER — Other Ambulatory Visit: Payer: Self-pay | Admitting: Osteopathic Medicine

## 2020-05-05 ENCOUNTER — Other Ambulatory Visit: Payer: BC Managed Care – PPO

## 2020-05-06 ENCOUNTER — Ambulatory Visit
Admission: RE | Admit: 2020-05-06 | Discharge: 2020-05-06 | Disposition: A | Discharge status: Home / Self Care | Payer: BC Managed Care – PPO | Source: Ambulatory Visit | Attending: Hematology | Admitting: Hematology

## 2020-05-06 ENCOUNTER — Other Ambulatory Visit: Payer: Self-pay

## 2020-05-06 DIAGNOSIS — D0502 Lobular carcinoma in situ of left breast: Secondary | ICD-10-CM

## 2020-05-06 DIAGNOSIS — N6489 Other specified disorders of breast: Secondary | ICD-10-CM | POA: Diagnosis not present

## 2020-05-06 MED ORDER — GADOBUTROL 1 MMOL/ML IV SOLN
5.0000 mL | Freq: Once | INTRAVENOUS | Status: AC | PRN
  Administered 2020-05-06: 5 mL via INTRAVENOUS

## 2020-05-07 ENCOUNTER — Other Ambulatory Visit: Payer: Self-pay | Admitting: Hematology

## 2020-05-07 DIAGNOSIS — R9389 Abnormal findings on diagnostic imaging of other specified body structures: Secondary | ICD-10-CM

## 2020-05-08 ENCOUNTER — Other Ambulatory Visit: Payer: Self-pay

## 2020-05-08 ENCOUNTER — Ambulatory Visit: Payer: BC Managed Care – PPO

## 2020-05-08 ENCOUNTER — Ambulatory Visit
Admission: RE | Admit: 2020-05-08 | Discharge: 2020-05-08 | Disposition: A | Discharge status: Home / Self Care | Payer: BC Managed Care – PPO | Source: Ambulatory Visit | Attending: Hematology | Admitting: Hematology

## 2020-05-08 DIAGNOSIS — R922 Inconclusive mammogram: Secondary | ICD-10-CM | POA: Diagnosis not present

## 2020-05-08 DIAGNOSIS — R9389 Abnormal findings on diagnostic imaging of other specified body structures: Secondary | ICD-10-CM

## 2020-05-11 ENCOUNTER — Other Ambulatory Visit: Payer: Self-pay | Admitting: Hematology

## 2020-05-11 DIAGNOSIS — R928 Other abnormal and inconclusive findings on diagnostic imaging of breast: Secondary | ICD-10-CM

## 2020-05-14 ENCOUNTER — Other Ambulatory Visit: Payer: Self-pay | Admitting: Radiology

## 2020-05-14 ENCOUNTER — Ambulatory Visit
Admission: RE | Admit: 2020-05-14 | Discharge: 2020-05-14 | Disposition: A | Discharge status: Home / Self Care | Payer: BC Managed Care – PPO | Source: Ambulatory Visit | Attending: Hematology | Admitting: Hematology

## 2020-05-14 ENCOUNTER — Other Ambulatory Visit: Payer: Self-pay

## 2020-05-14 DIAGNOSIS — R928 Other abnormal and inconclusive findings on diagnostic imaging of breast: Secondary | ICD-10-CM

## 2020-06-21 ENCOUNTER — Other Ambulatory Visit: Payer: Self-pay | Admitting: Osteopathic Medicine

## 2020-06-21 ENCOUNTER — Other Ambulatory Visit: Payer: Self-pay | Admitting: Hematology

## 2020-07-01 ENCOUNTER — Encounter: Payer: Self-pay | Admitting: Hematology

## 2020-07-15 DIAGNOSIS — M47813 Spondylosis without myelopathy or radiculopathy, cervicothoracic region: Secondary | ICD-10-CM | POA: Diagnosis not present

## 2020-07-15 DIAGNOSIS — M47817 Spondylosis without myelopathy or radiculopathy, lumbosacral region: Secondary | ICD-10-CM | POA: Diagnosis not present

## 2020-07-15 DIAGNOSIS — S336XXA Sprain of sacroiliac joint, initial encounter: Secondary | ICD-10-CM | POA: Diagnosis not present

## 2020-07-15 DIAGNOSIS — M4724 Other spondylosis with radiculopathy, thoracic region: Secondary | ICD-10-CM | POA: Diagnosis not present

## 2020-07-20 DIAGNOSIS — M47813 Spondylosis without myelopathy or radiculopathy, cervicothoracic region: Secondary | ICD-10-CM | POA: Diagnosis not present

## 2020-07-20 DIAGNOSIS — M4724 Other spondylosis with radiculopathy, thoracic region: Secondary | ICD-10-CM | POA: Diagnosis not present

## 2020-07-20 DIAGNOSIS — M47817 Spondylosis without myelopathy or radiculopathy, lumbosacral region: Secondary | ICD-10-CM | POA: Diagnosis not present

## 2020-07-20 DIAGNOSIS — S336XXA Sprain of sacroiliac joint, initial encounter: Secondary | ICD-10-CM | POA: Diagnosis not present

## 2020-09-07 DIAGNOSIS — Z01419 Encounter for gynecological examination (general) (routine) without abnormal findings: Secondary | ICD-10-CM | POA: Diagnosis not present

## 2020-09-07 DIAGNOSIS — Z6822 Body mass index (BMI) 22.0-22.9, adult: Secondary | ICD-10-CM | POA: Diagnosis not present

## 2020-09-07 LAB — HM PAP SMEAR: HM Pap smear: NEGATIVE

## 2020-09-18 ENCOUNTER — Other Ambulatory Visit: Payer: Self-pay | Admitting: Osteopathic Medicine

## 2020-09-23 ENCOUNTER — Other Ambulatory Visit: Payer: Self-pay

## 2020-09-23 ENCOUNTER — Encounter: Payer: Self-pay | Admitting: Osteopathic Medicine

## 2020-09-23 ENCOUNTER — Ambulatory Visit (INDEPENDENT_AMBULATORY_CARE_PROVIDER_SITE_OTHER): Payer: BC Managed Care – PPO | Admitting: Osteopathic Medicine

## 2020-09-23 VITALS — BP 120/64 | HR 94 | Temp 99.7°F | Wt 139.0 lb

## 2020-09-23 DIAGNOSIS — R7301 Impaired fasting glucose: Secondary | ICD-10-CM

## 2020-09-23 DIAGNOSIS — Z0000 Encounter for general adult medical examination without abnormal findings: Secondary | ICD-10-CM

## 2020-09-23 DIAGNOSIS — D0502 Lobular carcinoma in situ of left breast: Secondary | ICD-10-CM | POA: Diagnosis not present

## 2020-09-23 DIAGNOSIS — E039 Hypothyroidism, unspecified: Secondary | ICD-10-CM

## 2020-09-23 DIAGNOSIS — H5789 Other specified disorders of eye and adnexa: Secondary | ICD-10-CM

## 2020-09-23 DIAGNOSIS — Z973 Presence of spectacles and contact lenses: Secondary | ICD-10-CM

## 2020-09-23 MED ORDER — MONTELUKAST SODIUM 10 MG PO TABS: 10.0000 mg | ORAL_TABLET | Freq: Every day | ORAL | 3 refills | Status: DC

## 2020-09-23 MED ORDER — GUAIFENESIN-CODEINE 100-10 MG/5ML PO SYRP: 5.0000 mL | ORAL_SOLUTION | Freq: Four times a day (QID) | ORAL | 0 refills | Status: DC | PRN

## 2020-09-23 MED ORDER — OLOPATADINE HCL 0.2 % OP SOLN: 1.0000 [drp] | Freq: Two times a day (BID) | OPHTHALMIC | 1 refills | Status: DC

## 2020-09-23 NOTE — Progress Notes (Signed)
Sheri [name] is a 50 y.o. female who presents to  [facility]  today, 09/23/20, seeking care for the following:  Annual physical  Cough - s/p COVID infection last month  Eye redness, concern about allergies, concern about contacts making it worse      ASSESSMENT & PLAN with other pertinent findings:  The primary encounter diagnosis was Annual physical exam. Diagnoses of Hypothyroidism, unspecified type, Elevated fasting glucose, History Lobular carcinoma in situ (LCIS) of left breast, and Red eye associated with contact lens were also pertinent to this visit.   Cough meds sent Trial Singulair and Pataday for allergies/eye issues  Patient Instructions  General Preventive Care Most recent routine screening labs: ordered.  Blood pressure goal 130/80 or less.  Tobacco: don't!  Alcohol: responsible moderation is ok for most adults - if you have concerns about your alcohol intake, please talk to me!  Exercise: as tolerated to reduce risk of cardiovascular disease and diabetes. Strength training will also prevent osteoporosis.  Mental health: if need for mental health care (medicines, counseling, other), or concerns about moods, please let me know!  Sexual / Reproductive health: if need for STD testing, or if concerns with libido/pain problems, please let me know! If you need to discuss family planning, please let me know!  Advanced Directive: Living Will and/or Healthcare Power of Attorney recommended for all adults, regardless of age or health.  Vaccines Flu vaccine: for almost everyone, every fall.  Shingles vaccine: after age 65.  Pneumonia vaccines: after age 68. Tetanus booster: every 10 years, due 2028 COVID vaccine: STRONGLY RECOMMENDED  Cancer screenings  Colon cancer screening: for everyone age 37-75. Colonoscopy available for all, many people also qualify for the Cologuard stool test  Breast cancer surveillance:  per oncology  Cervical cancer screening: Pap per OBGYN Lung cancer screening: not needed for non-smokers  Infection screenings  HIV: recommended screening at least once age 57-65, more often as needed. Gonorrhea/Chlamydia: screening as needed Hepatitis C: recommended once for everyone age 0000000 TB: certain at-risk populations, or depending on work requirements and/or travel history Other Bone Density Test: recommended for women at age 56  Orders Placed This Encounter  Procedures   CBC   COMPLETE METABOLIC PANEL WITH GFR   Lipid panel   TSH   Hemoglobin A1c     Meds ordered this encounter  Medications   montelukast (SINGULAIR) 10 MG tablet    Sig: Take 1 tablet (10 mg total) by mouth at bedtime.    Dispense:  30 tablet    Refill:  3   guaiFENesin-codeine (ROBITUSSIN AC) 100-10 MG/5ML syrup    Sig: Take 5-10 mLs by mouth 4 (four) times daily as needed for cough or congestion.    Dispense:  180 mL    Refill:  0   Olopatadine HCl 0.2 % SOLN    Sig: Apply 1-2 drops to eye 2 (two) times daily.    Dispense:  5 mL    Refill:  1      See below for relevant physical exam findings  See below for recent lab and imaging results reviewed  Medications, allergies, PMH, PSH, SocH, FamH reviewed below    Follow-up instructions: Return in about 1 year (around 09/23/2021) for [place] (OR SOONER IF NEEDED), MYCHART SET TO ALERT YOU TO CALL TO SCHEDULE.  Exam:  BP 120/64 (BP Location: Left Arm, Patient Position: Sitting, Cuff Size: Normal)   Pulse 94   Temp 99.7 F (37.6 C) (Oral)   Wt 139 lb 0.6 oz (63.1 kg)   BMI 23.14 kg/m  Constitutional: VS see above. General Appearance: alert, well-developed, well-nourished, NAD Neck: No masses, trachea midline.  Respiratory: Normal respiratory effort. no wheeze, no rhonchi, no rales Cardiovascular: S1/S2 normal, no murmur, no rub/gallop auscultated.  RRR.  Musculoskeletal: Gait normal. Symmetric and independent movement of all extremities Abdominal: non-tender, non-distended, no appreciable organomegaly, neg Murphy's, BS WNLx4 Neurological: Normal balance/coordination. No tremor. Skin: warm, dry, intact.  Psychiatric: Normal judgment/insight. Normal mood and affect. Oriented x3.   Current Meds  Medication Sig   BIOTIN PO Take by mouth daily.   guaiFENesin-codeine (ROBITUSSIN AC) 100-10 MG/5ML syrup Take 5-10 mLs by mouth 4 (four) times daily as needed for cough or congestion.   levocetirizine (XYZAL) 5 MG tablet TAKE 1 TABLET BY MOUTH EVERY DAY IN THE EVENING   montelukast (SINGULAIR) 10 MG tablet Take 1 tablet (10 mg total) by mouth at bedtime.   Multiple Vitamin (MULTIVITAMIN) tablet Take 1 tablet by mouth daily.   Olopatadine HCl 0.2 % SOLN Apply 1-2 drops to eye 2 (two) times daily.   SYNTHROID 88 MCG tablet Take 1 tablet (88 mcg total) by mouth daily.   tamoxifen (NOLVADEX) 20 MG tablet TAKE 1 TABLET BY MOUTH EVERY DAY    Allergies  Allergen Reactions   Other Swelling   Erythromycin     Other reaction(s): GI UPSET   Erythromycin Base    Shellfish Allergy    Shellfish-Derived Products     Patient Active Problem List   Diagnosis Date Noted   Lobular carcinoma in situ (LCIS) of left breast 10/07/2017   Vertigo 12/28/2016   Eye abnormality 05/27/2016   Annual physical exam 05/27/2016   Hypothyroidism 07/07/2012    Family History  Problem Relation Age of Onset   Diabetes Mother    Hypertension Father    COPD Father    Cancer Paternal Aunt 70       breast cancer   Cancer Paternal Aunt 68       breast cancer   Cancer Cousin 27       breast cancer   Cancer Cousin        breast cancer   Cancer Other 20       breast cancer   Colon cancer Neg Hx    Rectal cancer Neg Hx    Stomach cancer Neg Hx     Social History   Tobacco Use  Smoking Status Never  Smokeless Tobacco Never    Past Surgical History:   Procedure Laterality Date   BREAST BIOPSY     BREAST EXCISIONAL BIOPSY     BREAST LUMPECTOMY WITH RADIOACTIVE SEED LOCALIZATION Left 09/15/2017   Procedure: BREAST LUMPECTOMY WITH RADIOACTIVE SEED LOCALIZATION;  Surgeon: Coralie Keens, MD;  Location: [facility];  Service: General;  Laterality: Left;   CYST EXCISION Right 09/15/2017   Procedure: EXCISION OF 1CM RIGHT BUTTOCK SEBACEOUS CYST;  Surgeon: Coralie Keens, MD;  Location: [facility];  Service: General;  Laterality: Right;   WISDOM TOOTH EXTRACTION      Immunization History  Administered Date(s) Administered   Influenza Split 03/20/2012   Influenza,inj,Quad PF,6+ Mos 10/31/2012, 11/05/2016   Influenza-Unspecified 11/09/2017, 11/08/2018, 11/01/2019   Td 07/02/2002   Tdap 05/26/2016    No results found for this or  any previous visit (from the past 2160 hour(s)).  No results found.     All questions at time of visit were answered - patient instructed to contact office with any additional concerns or updates. ER/RTC precautions were reviewed with the patient as applicable.   Please note: manual typing as well as voice recognition software may have been used to produce this document - typos may escape review. Please contact Dr. Sheppard Coil for any needed clarifications.

## 2020-09-23 NOTE — Patient Instructions (Signed)
General Preventive Care Most recent routine screening labs: ordered.  Blood pressure goal 130/80 or less.  Tobacco: don't!  Alcohol: responsible moderation is ok for most adults - if you have concerns about your alcohol intake, please talk to me!  Exercise: as tolerated to reduce risk of cardiovascular disease and diabetes. Strength training will also prevent osteoporosis.  Mental health: if need for mental health care (medicines, counseling, other), or concerns about moods, please let me know!  Sexual / Reproductive health: if need for STD testing, or if concerns with libido/pain problems, please let me know! If you need to discuss family planning, please let me know!  Advanced Directive: Living Will and/or Healthcare Power of Attorney recommended for all adults, regardless of age or health.  Vaccines Flu vaccine: for almost everyone, every fall.  Shingles vaccine: after age 83.  Pneumonia vaccines: after age 90. Tetanus booster: every 10 years, due 2028 COVID vaccine: STRONGLY RECOMMENDED  Cancer screenings  Colon cancer screening: for everyone age 64-75. Colonoscopy available for all, many people also qualify for the Cologuard stool test  Breast cancer surveillance: per oncology  Cervical cancer screening: Pap per OBGYN Lung cancer screening: not needed for non-smokers  Infection screenings  HIV: recommended screening at least once age 7-65, more often as needed. Gonorrhea/Chlamydia: screening as needed Hepatitis C: recommended once for everyone age 0000000 TB: certain at-risk populations, or depending on work requirements and/or travel history Other Bone Density Test: recommended for women at age 43

## 2020-09-24 LAB — COMPLETE METABOLIC PANEL WITH GFR
AG Ratio: 1.6 (calc) (ref 1.0–2.5)
ALT: 7 U/L (ref 6–29)
AST: 16 U/L (ref 10–35)
Albumin: 4.2 g/dL (ref 3.6–5.1)
Alkaline phosphatase (APISO): 45 U/L (ref 31–125)
BUN: 13 mg/dL (ref 7–25)
CO2: 24 mmol/L (ref 20–32)
Calcium: 9.2 mg/dL (ref 8.6–10.2)
Chloride: 107 mmol/L (ref 98–110)
Creat: 0.71 mg/dL (ref 0.50–0.99)
Globulin: 2.6 g/dL (calc) (ref 1.9–3.7)
Glucose, Bld: 80 mg/dL (ref 65–99)
Potassium: 4.2 mmol/L (ref 3.5–5.3)
Sodium: 137 mmol/L (ref 135–146)
Total Bilirubin: 0.4 mg/dL (ref 0.2–1.2)
Total Protein: 6.8 g/dL (ref 6.1–8.1)
eGFR: 104 mL/min/{1.73_m2} (ref >=60)

## 2020-09-24 LAB — LIPID PANEL
Cholesterol: 185 mg/dL (ref <200)
HDL: 95 mg/dL (ref >=50)
LDL Cholesterol (Calc): 76 mg/dL (calc)
Non-HDL Cholesterol (Calc): 90 mg/dL (calc) (ref <130)
Total CHOL/HDL Ratio: 1.9 (calc) (ref <5.0)
Triglycerides: 61 mg/dL (ref <150)

## 2020-09-24 LAB — TSH: TSH: 1.48 mIU/L

## 2020-09-24 LAB — CBC
HCT: 37.9 % (ref 35.0–45.0)
Hemoglobin: 12.7 g/dL (ref 11.7–15.5)
MCH: 29.3 pg (ref 27.0–33.0)
MCHC: 33.5 g/dL (ref 32.0–36.0)
MCV: 87.3 fL (ref 80.0–100.0)
MPV: 12.1 fL (ref 7.5–12.5)
Platelets: 202 10*3/uL (ref 140–400)
RBC: 4.34 10*6/uL (ref 3.80–5.10)
RDW: 13.1 % (ref 11.0–15.0)
WBC: 4.3 10*3/uL (ref 3.8–10.8)

## 2020-09-24 LAB — HEMOGLOBIN A1C
Hgb A1c MFr Bld: 5.2 % of total Hgb (ref <5.7)
Mean Plasma Glucose: 103 mg/dL
eAG (mmol/L): 5.7 mmol/L

## 2020-10-01 DIAGNOSIS — Z79810 Long term (current) use of selective estrogen receptor modulators (SERMs): Secondary | ICD-10-CM | POA: Diagnosis not present

## 2020-10-15 ENCOUNTER — Encounter: Payer: Self-pay | Admitting: Hematology

## 2020-10-19 ENCOUNTER — Other Ambulatory Visit: Payer: Self-pay | Admitting: Hematology

## 2020-10-19 DIAGNOSIS — D0502 Lobular carcinoma in situ of left breast: Secondary | ICD-10-CM

## 2020-10-19 DIAGNOSIS — H16223 Keratoconjunctivitis sicca, not specified as Sjogren's, bilateral: Secondary | ICD-10-CM | POA: Diagnosis not present

## 2020-10-19 DIAGNOSIS — H16103 Unspecified superficial keratitis, bilateral: Secondary | ICD-10-CM | POA: Diagnosis not present

## 2020-10-22 ENCOUNTER — Other Ambulatory Visit: Payer: Self-pay | Admitting: Student in an Organized Health Care Education/Training Program

## 2020-10-22 ENCOUNTER — Other Ambulatory Visit: Payer: Self-pay | Admitting: Obstetrics and Gynecology

## 2020-10-22 DIAGNOSIS — Z1231 Encounter for screening mammogram for malignant neoplasm of breast: Secondary | ICD-10-CM

## 2020-11-18 DIAGNOSIS — H16223 Keratoconjunctivitis sicca, not specified as Sjogren's, bilateral: Secondary | ICD-10-CM | POA: Diagnosis not present

## 2020-11-23 ENCOUNTER — Ambulatory Visit: Payer: BC Managed Care – PPO

## 2020-11-24 ENCOUNTER — Other Ambulatory Visit: Payer: Self-pay

## 2020-11-24 ENCOUNTER — Emergency Department
Admission: RE | Admit: 2020-11-24 | Discharge: 2020-11-24 | Disposition: A | Discharge status: Home / Self Care | Payer: BC Managed Care – PPO | Source: Ambulatory Visit

## 2020-11-24 VITALS — BP 103/70 | HR 103 | Temp 99.0°F | Resp 16 | Ht 65.0 in

## 2020-11-24 DIAGNOSIS — J0140 Acute pansinusitis, unspecified: Secondary | ICD-10-CM

## 2020-11-24 MED ORDER — FLUTICASONE PROPIONATE 50 MCG/ACT NA SUSP: 2.0000 | Freq: Every day | NASAL | 0 refills | Status: DC

## 2020-11-24 MED ORDER — AMOXICILLIN 875 MG PO TABS: 875.0000 mg | ORAL_TABLET | Freq: Two times a day (BID) | ORAL | 0 refills | Status: AC

## 2020-11-24 NOTE — ED Triage Notes (Addendum)
Sinus congestion & facial pressure since Friday  OTC Sinus congestion med  Sudafed PE Denies fever No relief w/ OTC meds  COVID booster & flu vaccine  Negative Covid test

## 2020-11-24 NOTE — Discharge Instructions (Signed)
Continue to drink lots of fluids Add Flonase 2 times a day for 2 to 3 days then once a day until symptoms improve Continue with your saline nasal spray Add amoxicillin if you fail to improve as expected, or if your symptoms worsen

## 2020-12-14 ENCOUNTER — Telehealth: Payer: Self-pay

## 2020-12-14 MED ORDER — OSELTAMIVIR PHOSPHATE 75 MG PO CAPS: 75.0000 mg | ORAL_CAPSULE | Freq: Every day | ORAL | 0 refills | Status: DC

## 2020-12-14 NOTE — Telephone Encounter (Signed)
Yes it was sent to pharmacy.

## 2020-12-14 NOTE — Telephone Encounter (Signed)
Sheri [name] called and left a message stating she would like Tamiflu because she is exposed to the flu. Her daughter has the flu and is being treated for the flu with Tamiflu. She reports no symptoms. Please advise.

## 2020-12-14 NOTE — Telephone Encounter (Signed)
Patient advised.

## 2020-12-16 ENCOUNTER — Other Ambulatory Visit: Payer: Self-pay | Admitting: Osteopathic Medicine

## 2020-12-23 ENCOUNTER — Ambulatory Visit
Admission: RE | Admit: 2020-12-23 | Discharge: 2020-12-23 | Disposition: A | Discharge status: Home / Self Care | Payer: BC Managed Care – PPO | Source: Ambulatory Visit | Attending: Obstetrics and Gynecology | Admitting: Obstetrics and Gynecology

## 2020-12-23 ENCOUNTER — Other Ambulatory Visit: Payer: Self-pay

## 2020-12-23 DIAGNOSIS — Z1231 Encounter for screening mammogram for malignant neoplasm of breast: Secondary | ICD-10-CM | POA: Diagnosis not present

## 2021-01-06 ENCOUNTER — Ambulatory Visit
Admission: RE | Admit: 2021-01-06 | Discharge: 2021-01-06 | Disposition: A | Discharge status: Home / Self Care | Payer: BC Managed Care – PPO | Source: Ambulatory Visit | Attending: Hematology | Admitting: Hematology

## 2021-01-06 DIAGNOSIS — D0502 Lobular carcinoma in situ of left breast: Secondary | ICD-10-CM

## 2021-01-06 DIAGNOSIS — Z1239 Encounter for other screening for malignant neoplasm of breast: Secondary | ICD-10-CM | POA: Diagnosis not present

## 2021-01-06 DIAGNOSIS — N6489 Other specified disorders of breast: Secondary | ICD-10-CM | POA: Diagnosis not present

## 2021-01-06 MED ORDER — GADOBUTROL 1 MMOL/ML IV SOLN
6.0000 mL | Freq: Once | INTRAVENOUS | Status: AC | PRN
  Administered 2021-01-06: 6 mL via INTRAVENOUS

## 2021-01-25 ENCOUNTER — Other Ambulatory Visit: Payer: Self-pay | Admitting: Osteopathic Medicine

## 2021-02-15 ENCOUNTER — Ambulatory Visit: Payer: BC Managed Care – PPO | Admitting: Medical-Surgical

## 2021-03-13 ENCOUNTER — Other Ambulatory Visit: Payer: Self-pay | Admitting: Physician Assistant

## 2021-03-14 ENCOUNTER — Other Ambulatory Visit: Payer: Self-pay | Admitting: Physician Assistant

## 2021-03-21 IMAGING — MR MR BREAST BILAT WO/W CM
5 of 11 series · 24 of 48 positions shown · IV contrast (gadavist)
Comparison: Previous exam(s).
COMPARISON: Previous exam(s).

Addendum:
CLINICAL DATA: 49-year-old female with history of left breast
biopsy 08/15/2017 demonstrating atypical lobular hyperplasia, with
definitive pathology on excision 09/15/2017 showing lobular
carcinoma in situ. Of note, a surgical clip for localization was
placed in the upper-outer quadrant of the left breast at the time of
excision, which has the appearance of a ribbon shaped biopsy marking
clip on mammography. She has had a benign MRI biopsy of the right
breast lower outer quadrant 12/06/2017 demonstrating fibrocystic
changes and all caps that.

LABS:  None.
EXAM:
BILATERAL BREAST MRI WITH AND WITHOUT CONTRAST
TECHNIQUE: Multiplanar, multisequence MR images of both breasts were obtained
prior to and following the intravenous administration of 6 ml of
Gadavist

[Series 2: T2 · axial · 3.0mm · 0.78mm/px · z∈[-52,+125]mm · 3 of 60 slices shown]
[im 1/60]
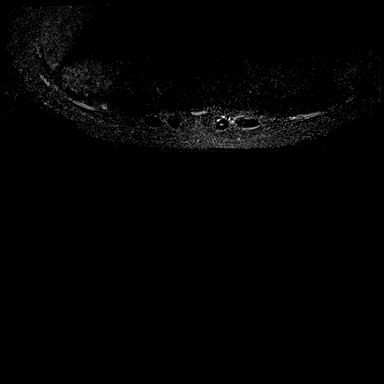
[im 30/60]
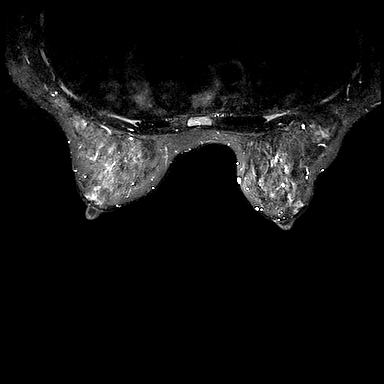
[im 60/60]
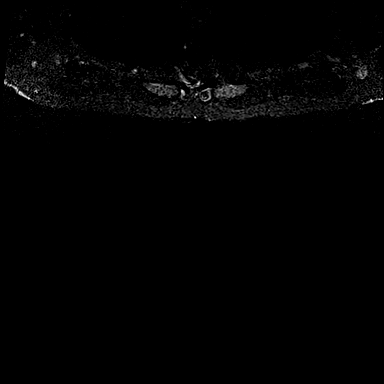

[Series 3: T1 fat-sat · axial · 1.2mm · 0.71mm/px · z∈[-55,+135]mm · 8 of 160 slices shown (1 of 3)]
[im 1/160]
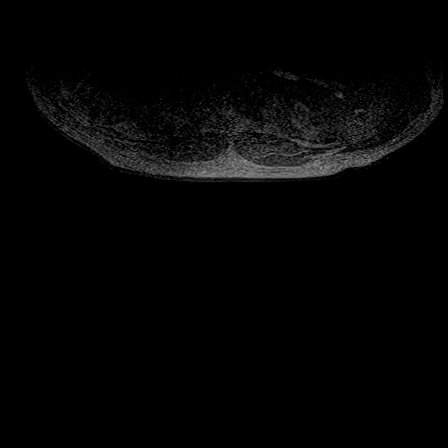
[im 23/160]
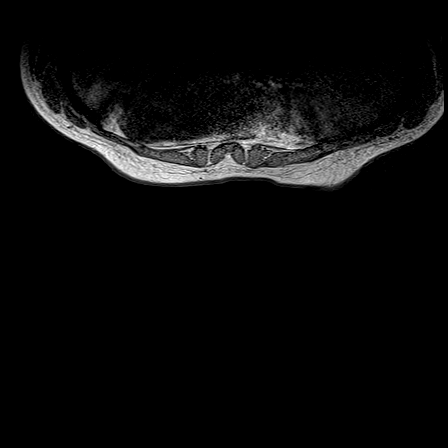
[im 46/160]
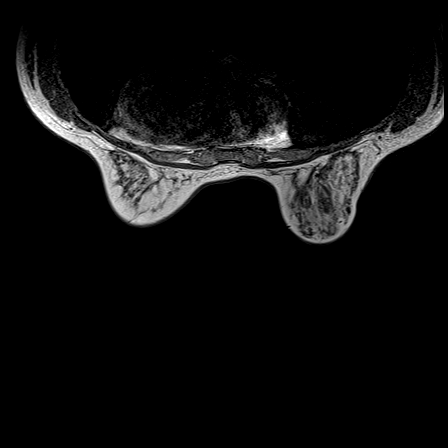
[im 69/160]
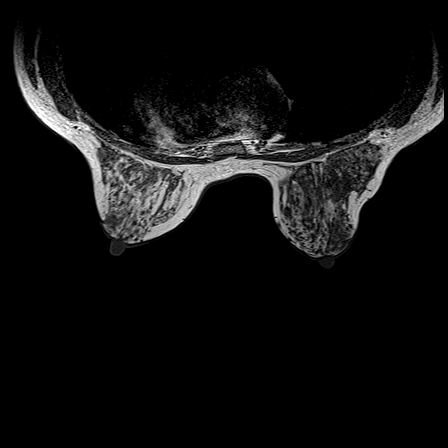
[im 91/160]
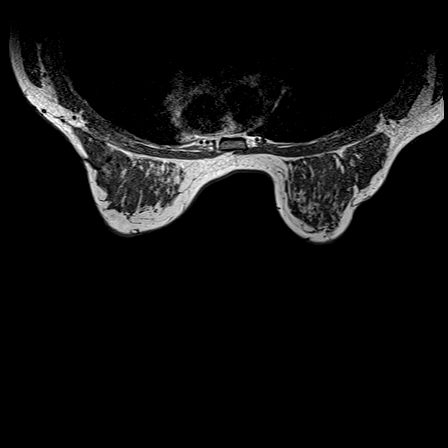
[im 114/160]
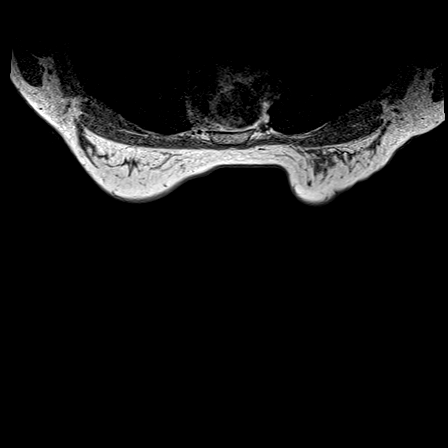
[im 137/160]
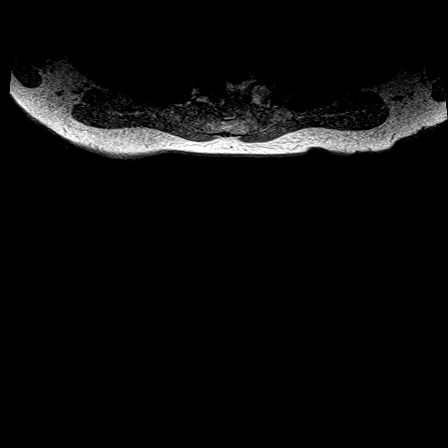
[im 160/160]
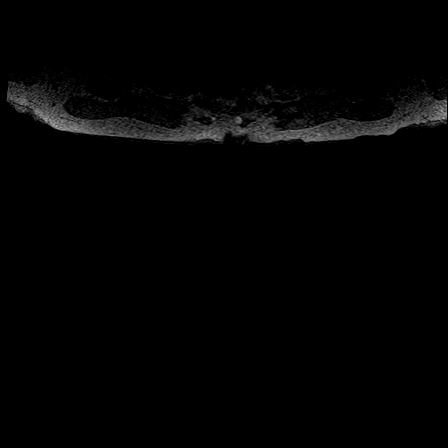

[Series 5: T1 fat-sat · axial · 1.6mm · 0.75mm/px · z∈[-52,+125]mm · 5 of 112 slices shown (2 of 3)]
[im 1/112]
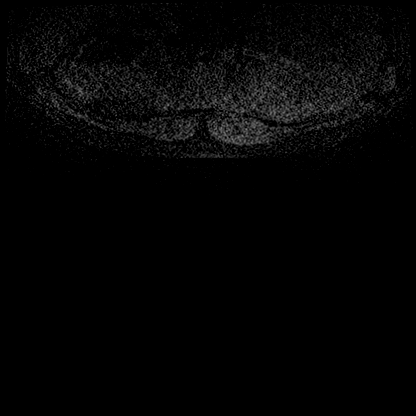
[im 28/112]
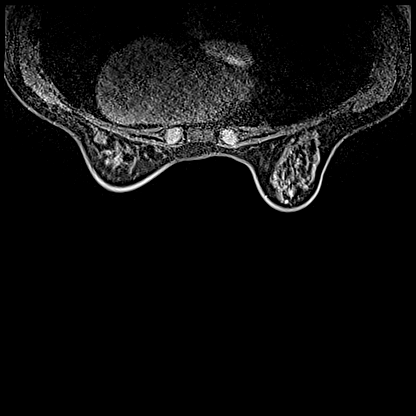
[im 56/112]
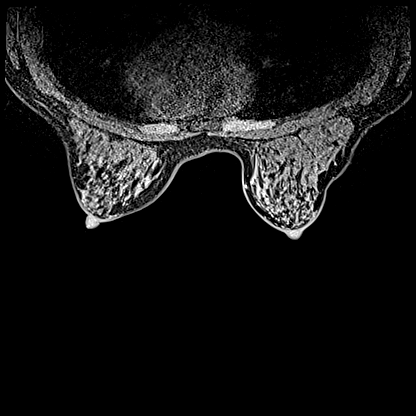
[im 84/112]
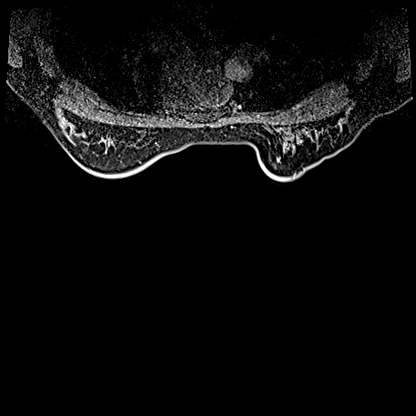
[im 112/112]
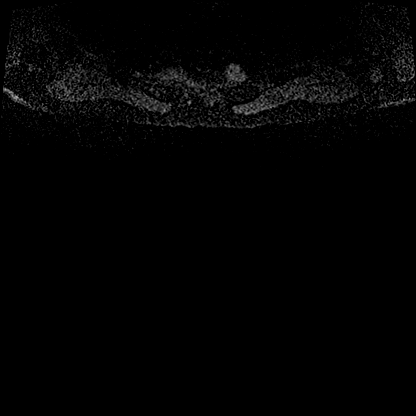

[Series 6: T1 fat-sat · axial · 1.6mm · 0.75mm/px · z∈[-52,+125]mm · 5 of 112 slices shown (3 of 3)]
[im 1/112]
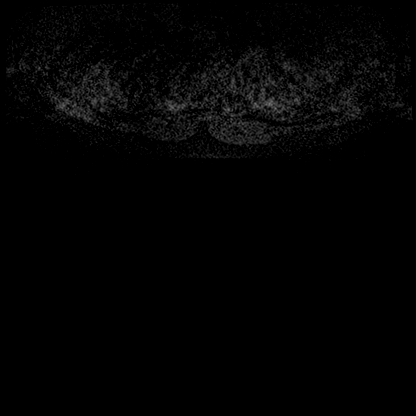
[im 28/112]
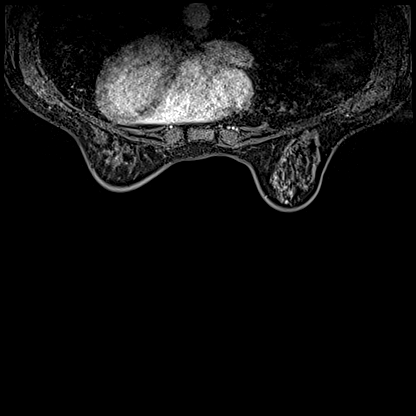
[im 56/112]
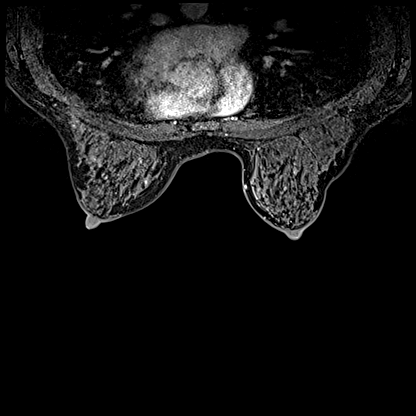
[im 84/112]
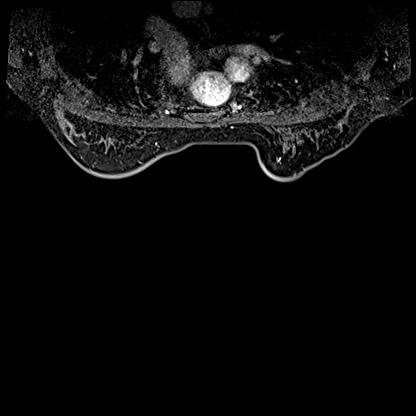
[im 112/112]
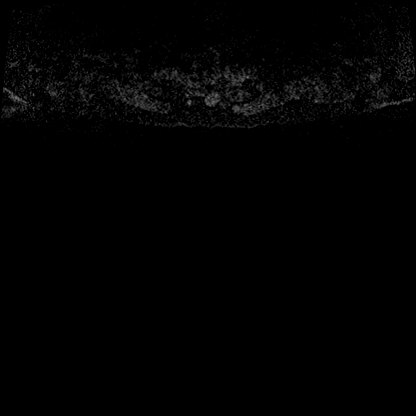

[Series 7: T1 · axial · 1.6mm · 0.75mm/px · z∈[-52,+36]mm · 3 of 112 slices shown]
[im 1/112]
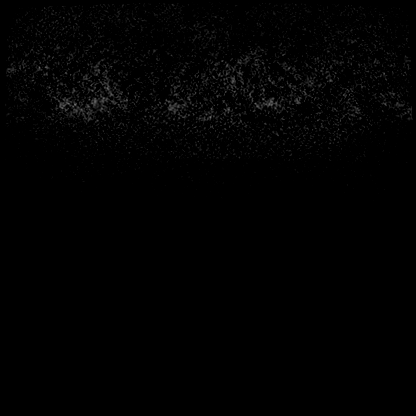
[im 28/112]
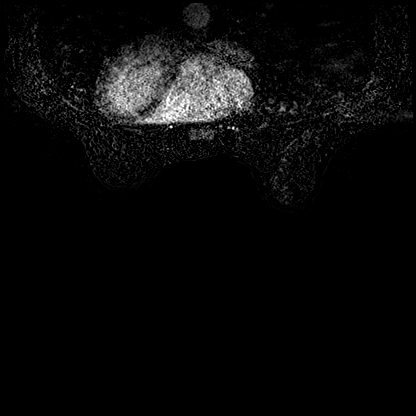
[im 56/112]
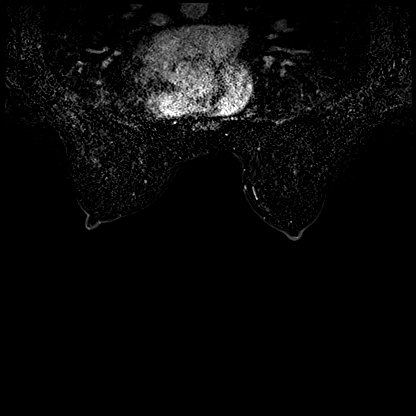

[24 of 48 positions shown; findings below may reference images not displayed]

Three-dimensional MR images were rendered by post-processing of the
original MR data on an independent workstation. The
three-dimensional MR images were interpreted, and findings are
reported in the following complete MRI report for this study. Three
dimensional images were evaluated at the independent [employer]
workstation
FINDINGS: Breast composition: c. Heterogeneous fibroglandular tissue.

Background parenchymal enhancement: Mild.

Right breast: No mass or abnormal enhancement. Susceptibility
artifact is seen in the inferior posterior right breast from the
HydroMARK biopsy marking clip which was placed during her prior
benign MRI biopsy. No suspicious changes are seen surrounding the
biopsy site.

Left breast: In the superior slightly lateral middle to posterior
depth of the left breast, there is a 1.9 cm area of linear non mass
enhancement seen on the delayed images (series 13, image 53). This
is associated with persistent kinetics. Susceptibility artifact in
the upper-outer quadrant of the left breast corresponds with a
surgical clip at the site of the lumpectomy bed. No suspicious
changes are seen the surgical site.

Lymph nodes: No abnormal appearing lymph nodes.

Ancillary findings:  None.
IMPRESSION: 1. Indeterminate linear non mass enhancement in the superior
slightly lateral left breast.

2.  No evidence of malignancy in the right breast.

RECOMMENDATION:
MRI guided biopsy is recommended for the 1.9 cm linear non mass
enhancement in the superior slightly lateral left breast

BI-RADS CATEGORY  4: Suspicious.

ADDENDUM:
Correction is necessary for laterality in the report. The labeling
of the left and right breast in PACS is incorrect. Therefore, the
dictation of the body of the report is as follows:

Right breast: In the superior slightly lateral, middle to posterior
depth of the right breast, there is a 1.9 cm area of linear non mass
enhancement seen on the delayed images (series 13, image 53). This
is associated with persistent kinetics. Susceptibility artifact is
seen in the inferior posterior right breast from the HydroMARK
biopsy marking clip which was placed during her prior benign MRI
biopsy. No suspicious changes are seen surrounding the biopsy site.

Left breast: No mass or abnormal enhancement. Susceptibility
artifact in the upper-outer quadrant of the left breast corresponds
with a surgical clip at the site of the lumpectomy bed. No
suspicious changes are seen at the lumpectomy site.
IMPRESSION: 1. Indeterminate linear non mass enhancement in the superior
slightly lateral right breast.

2.  No evidence of malignancy in the left breast.

RECOMMENDATION:

MRI guided biopsy is recommended for the 1.9 cm linear non mass
enhancement in the superior slightly lateral right breast.

*** End of Addendum ***
Three-dimensional MR images were rendered by post-processing of the
original MR data on an independent workstation. The
three-dimensional MR images were interpreted, and findings are
reported in the following complete MRI report for this study. Three
dimensional images were evaluated at the independent [employer]
workstation
FINDINGS: Breast composition: c. Heterogeneous fibroglandular tissue.

Background parenchymal enhancement: Mild.

Right breast: No mass or abnormal enhancement. Susceptibility
artifact is seen in the inferior posterior right breast from the
HydroMARK biopsy marking clip which was placed during her prior
benign MRI biopsy. No suspicious changes are seen surrounding the
biopsy site.

Left breast: In the superior slightly lateral middle to posterior
depth of the left breast, there is a 1.9 cm area of linear non mass
enhancement seen on the delayed images (series 13, image 53). This
is associated with persistent kinetics. Susceptibility artifact in
the upper-outer quadrant of the left breast corresponds with a
surgical clip at the site of the lumpectomy bed. No suspicious
changes are seen the surgical site.

Lymph nodes: No abnormal appearing lymph nodes.

Ancillary findings:  None.
IMPRESSION: 1. Indeterminate linear non mass enhancement in the superior
slightly lateral left breast.

2.  No evidence of malignancy in the right breast.

RECOMMENDATION:
MRI guided biopsy is recommended for the 1.9 cm linear non mass
enhancement in the superior slightly lateral left breast

BI-RADS CATEGORY  4: Suspicious.

## 2021-03-26 ENCOUNTER — Other Ambulatory Visit: Payer: Self-pay

## 2021-03-26 ENCOUNTER — Ambulatory Visit (INDEPENDENT_AMBULATORY_CARE_PROVIDER_SITE_OTHER): Payer: BC Managed Care – PPO | Admitting: Medical-Surgical

## 2021-03-26 ENCOUNTER — Encounter: Payer: Self-pay | Admitting: Medical-Surgical

## 2021-03-26 VITALS — BP 120/78 | HR 79 | Resp 20 | Ht 65.0 in | Wt 139.4 lb

## 2021-03-26 DIAGNOSIS — J302 Other seasonal allergic rhinitis: Secondary | ICD-10-CM | POA: Diagnosis not present

## 2021-03-26 DIAGNOSIS — Z7689 Persons encountering health services in other specified circumstances: Secondary | ICD-10-CM

## 2021-03-26 DIAGNOSIS — E039 Hypothyroidism, unspecified: Secondary | ICD-10-CM

## 2021-03-26 MED ORDER — FLUTICASONE PROPIONATE 50 MCG/ACT NA SUSP: 2.0000 | Freq: Every day | NASAL | 5 refills | Status: DC

## 2021-03-26 NOTE — Progress Notes (Signed)
°  HPI with pertinent ROS:   CC: Transfer of care  HPI: Pleasant 51 year old female presenting today to transfer care to a new PCP.  She is currently taking Synthroid 88 mcg daily, tolerating well without side effects.  Has always taken the brand name of this medication rather than generic.  She gets her labs checked regularly to make sure that her thyroid levels are within normal limits.  Had an episode where she felt fluttering in her chest the other day but that was the first time she never felt and has not experienced it since.  No chest pain, increased anxiety, weight changes, skin changes, or hair loss.  Seasonal allergies-taking Xyzal 5 mg nightly along with singular 10 mg nightly.  Using Flonase however she is out of this prescription and would like to get restarted since trees are starting to bloom.  I reviewed the past medical history, family history, social history, surgical history, and allergies today and no changes were needed.  Please see the problem list section below in epic for further details.   Physical exam:   General: Well Developed, well nourished, and in no acute distress.  Neuro: Alert and oriented x3.  HEENT: Normocephalic, atraumatic.  Skin: Warm and dry. Cardiac: Regular rate and rhythm, no murmurs rubs or gallops, no lower extremity edema.  Respiratory: Clear to auscultation bilaterally. Not using accessory muscles, speaking in full sentences.  Impression and Recommendations:    1. Encounter to establish care Reviewed available information and discussed care concerns with patient.   2. Hypothyroidism, unspecified type Checking TSH today.  Continue Synthroid 88 mcg daily, titrate per lab results. - TSH  3. Seasonal allergies Continue Xyzal 5 mg and Singulair 10 mg nightly.  Refilling Flonase.   Return in about 6 months (around 09/23/2021) for annual physical exam. ___________________________________________ Clearnce Sorrel, DNP, APRN, FNP-BC [facility]

## 2021-03-27 LAB — TSH: TSH: 0.7 mIU/L

## 2021-03-29 ENCOUNTER — Other Ambulatory Visit: Payer: Self-pay | Admitting: Medical-Surgical

## 2021-03-29 MED ORDER — LEVOTHYROXINE SODIUM 88 MCG PO TABS: 88.0000 ug | ORAL_TABLET | Freq: Every day | ORAL | 3 refills | Status: DC

## 2021-03-31 ENCOUNTER — Other Ambulatory Visit: Payer: Self-pay

## 2021-03-31 MED ORDER — LEVOTHYROXINE SODIUM 88 MCG PO TABS: 88.0000 ug | ORAL_TABLET | Freq: Every day | ORAL | 3 refills | Status: DC

## 2021-04-16 ENCOUNTER — Other Ambulatory Visit: Payer: Self-pay

## 2021-04-16 ENCOUNTER — Inpatient Hospital Stay: Payer: BC Managed Care – PPO

## 2021-04-16 ENCOUNTER — Inpatient Hospital Stay: Payer: BC Managed Care – PPO | Attending: Hematology | Admitting: Hematology

## 2021-04-16 VITALS — BP 122/72 | HR 79 | Temp 98.7°F | Resp 17 | Wt 139.6 lb

## 2021-04-16 DIAGNOSIS — E611 Iron deficiency: Secondary | ICD-10-CM | POA: Insufficient documentation

## 2021-04-16 DIAGNOSIS — Z1231 Encounter for screening mammogram for malignant neoplasm of breast: Secondary | ICD-10-CM

## 2021-04-16 DIAGNOSIS — D649 Anemia, unspecified: Secondary | ICD-10-CM | POA: Diagnosis not present

## 2021-04-16 DIAGNOSIS — D0502 Lobular carcinoma in situ of left breast: Secondary | ICD-10-CM

## 2021-04-16 DIAGNOSIS — D500 Iron deficiency anemia secondary to blood loss (chronic): Secondary | ICD-10-CM

## 2021-04-16 DIAGNOSIS — N6022 Fibroadenosis of left breast: Secondary | ICD-10-CM | POA: Diagnosis not present

## 2021-04-16 DIAGNOSIS — Z79899 Other long term (current) drug therapy: Secondary | ICD-10-CM | POA: Diagnosis not present

## 2021-04-16 DIAGNOSIS — Z881 Allergy status to other antibiotic agents status: Secondary | ICD-10-CM | POA: Insufficient documentation

## 2021-04-16 DIAGNOSIS — Z79810 Long term (current) use of selective estrogen receptor modulators (SERMs): Secondary | ICD-10-CM | POA: Insufficient documentation

## 2021-04-16 DIAGNOSIS — E039 Hypothyroidism, unspecified: Secondary | ICD-10-CM | POA: Diagnosis not present

## 2021-04-16 LAB — CBC WITH DIFFERENTIAL (CANCER CENTER ONLY)
Abs Immature Granulocytes: 0.01 10*3/uL (ref 0.00–0.07)
Basophils Absolute: 0 10*3/uL (ref 0.0–0.1)
Basophils Relative: 0 %
Eosinophils Absolute: 0 10*3/uL (ref 0.0–0.5)
Eosinophils Relative: 1 %
HCT: 35.5 % — ABNORMAL LOW (ref 36.0–46.0)
Hemoglobin: 11.9 g/dL — ABNORMAL LOW (ref 12.0–15.0)
Immature Granulocytes: 0 %
Lymphocytes Relative: 47 %
Lymphs Abs: 2.4 10*3/uL (ref 0.7–4.0)
MCH: 28.5 pg (ref 26.0–34.0)
MCHC: 33.5 g/dL (ref 30.0–36.0)
MCV: 84.9 fL (ref 80.0–100.0)
Monocytes Absolute: 0.3 10*3/uL (ref 0.1–1.0)
Monocytes Relative: 7 %
Neutro Abs: 2.3 10*3/uL (ref 1.7–7.7)
Neutrophils Relative %: 45 %
Platelet Count: 193 10*3/uL (ref 150–400)
RBC: 4.18 MIL/uL (ref 3.87–5.11)
RDW: 13.9 % (ref 11.5–15.5)
WBC Count: 5.1 10*3/uL (ref 4.0–10.5)
nRBC: 0 % (ref 0.0–0.2)

## 2021-04-16 LAB — CMP (CANCER CENTER ONLY)
ALT: 13 U/L (ref 0–44)
AST: 20 U/L (ref 15–41)
Albumin: 4 g/dL (ref 3.5–5.0)
Alkaline Phosphatase: 53 U/L (ref 38–126)
Anion gap: 6 (ref 5–15)
BUN: 14 mg/dL (ref 6–20)
CO2: 25 mmol/L (ref 22–32)
Calcium: 9.2 mg/dL (ref 8.9–10.3)
Chloride: 108 mmol/L (ref 98–111)
Creatinine: 0.7 mg/dL (ref 0.44–1.00)
GFR, Estimated: >60 mL/min (ref >60)
Glucose, Bld: 86 mg/dL (ref 70–99)
Potassium: 3.8 mmol/L (ref 3.5–5.1)
Sodium: 139 mmol/L (ref 135–145)
Total Bilirubin: 0.4 mg/dL (ref 0.3–1.2)
Total Protein: 6.9 g/dL (ref 6.5–8.1)

## 2021-04-16 NOTE — Progress Notes (Signed)
?[facility]   ?[phone] [phone]   ?Clinic Follow up Note  ? ?Patient Care Team: ?Samuel Bouche, NP as PCP - General (Nurse Practitioner) ?Coralie Keens, MD as Consulting Physician (General Surgery) ?Truitt Merle, MD as Consulting Physician (Hematology) ? ?Date of Service:  04/16/2021 ? ?CHIEF COMPLAINT: f/u of LCIS ? ?CURRENT THERAPY:  ?Tamoxifen '20mg'$  daily started 10/09/2017 ? ?ASSESSMENT & PLAN:  ?Sheri [name] is a 51 y.o. female with  ? ?1. Lobular carcinoma in situ of left breast ?-Diagnosed in 10/2017. Treated with left lumpectomy. Currently on Tamoxifen to reduce her future risk of breast cancer. Tolerating well with minimal side effects. She is still having periods. ?-most recent mammogram 12/23/20 and MRI (for dense breasts) 01/06/21 were both benign ?-She is clinically doing well. Lab reviewed, her CBC and CMP are within normal limits except for her very mild anemia. Her physical exam was unremarkable. There is no clinical concern for recurrence. ?-Continue tamoxifen, lab and follow-up in one year. She will finish tamoxifen next year. ?  ?2. Iron deficiency ?-Likely related to her heavy menstrual. She has very mild anemia  ?-I previously recommended oral iron ?-ferritin today is pending. ?  ? ?Plan  ?-continue tamoxifen ?-lab and f/u in 1 year ?-mammogram due 12/2021, MRI due 07/2022, I ordered today  ? ? ?No problem-specific Assessment & Plan notes found for this encounter. ? ? ?SUMMARY OF ONCOLOGIC HISTORY: ?Oncology History  ?Lobular carcinoma in situ (LCIS) of left breast  ?08/15/2017 Initial Biopsy  ? Diagnosis ?Breast, left, needle core biopsy, upper outer quadrant ?- LOBULAR NEOPLASIA (ATYPICAL LOBULAR HYPERPLASIA). ?- FIBROCYSTIC CHANGE AND ADENOSIS WITH CALCIFICATIONS. ?- PSEUDOANGIOMATOUS STROMAL HYPERPLASIA ([place]). ?  ?09/15/2017 Pathology Results  ? Diagnosis ?Breast, lumpectomy, left ?- LOBULAR NEOPLASIA (LOBULAR CARCINOMA IN SITU). ?- HEALING BIOPSY  SITE. ?- SEE COMMENT. ?  ?10/07/2017 Initial Diagnosis  ? Lobular carcinoma in situ (LCIS) of left breast ?  ?10/10/2018 -  Anti-estrogen oral therapy  ? Tamoxifen started 10/09/2017 ?  ? ? ? ?INTERVAL HISTORY:  ?Sheri [name] is here for a follow up of LCIS. She was last seen by me a year ago. She presents to the clinic alone. ?She reports she is doing well overall, denies side effects from tamoxifen. She also denies bothersome joint pain. ?  ?All other systems were reviewed with the patient and are negative. ? ?MEDICAL HISTORY:  ?Past Medical History:  ?Diagnosis Date  ? Allergy   ? Breast mass, left   ? DCIS (ductal carcinoma in situ)   ? left breast  ? Hypothyroidism   ? Seasonal allergies   ? Sebaceous cyst   ? right buttock  ? Thyroid disease   ? ? ?SURGICAL HISTORY: ?Past Surgical History:  ?Procedure Laterality Date  ? BREAST BIOPSY    ? BREAST EXCISIONAL BIOPSY    ? BREAST LUMPECTOMY WITH RADIOACTIVE SEED LOCALIZATION Left 09/15/2017  ? Procedure: BREAST LUMPECTOMY WITH RADIOACTIVE SEED LOCALIZATION;  Surgeon: Coralie Keens, MD;  Location: [facility];  Service: General;  Laterality: Left;  ? CYST EXCISION Right 09/15/2017  ? Procedure: EXCISION OF 1CM RIGHT BUTTOCK SEBACEOUS CYST;  Surgeon: Coralie Keens, MD;  Location: [facility];  Service: General;  Laterality: Right;  ? WISDOM TOOTH EXTRACTION    ? ? ?I have reviewed the social history and family history with the patient and they are unchanged from previous note. ? ?ALLERGIES:  is allergic to shellfish allergy, shellfish-derived products, other, erythromycin base,  and erythromycin. ? ?MEDICATIONS:  ?Current Outpatient Medications  ?Medication Sig Dispense Refill  ? BIOTIN PO Take by mouth daily.    ? fluticasone (FLONASE) 50 MCG/ACT nasal spray Place 2 sprays into both nostrils daily. 16 g 5  ? levocetirizine (XYZAL) 5 MG tablet TAKE 1 TABLET BY MOUTH EVERY DAY IN THE EVENING 90 tablet 0  ? levothyroxine (SYNTHROID) 88  MCG tablet Take 1 tablet (88 mcg total) by mouth daily. 90 tablet 3  ? montelukast (SINGULAIR) 10 MG tablet TAKE 1 TABLET BY MOUTH EVERYDAY AT BEDTIME 90 tablet 0  ? Multiple Vitamin (MULTIVITAMIN) tablet Take 1 tablet by mouth daily.    ? Polyethyl Glycol-Propyl Glycol (SYSTANE) 0.4-0.3 % SOLN Apply to eye.    ? RESTASIS 0.05 % ophthalmic emulsion 1 drop 2 (two) times daily.    ? tamoxifen (NOLVADEX) 20 MG tablet TAKE 1 TABLET BY MOUTH EVERY DAY 90 tablet 3  ? ?No current facility-administered medications for this visit.  ? ? ?PHYSICAL EXAMINATION: ?ECOG PERFORMANCE STATUS: 0 - Asymptomatic ? ?There were no vitals filed for this visit. ?Wt Readings from Last 3 Encounters:  ?03/26/21 139 lb 6.4 oz (63.2 kg)  ?09/23/20 139 lb 0.6 oz (63.1 kg)  ?04/16/20 138 lb 11.2 oz (62.9 kg)  ?  ? ?GENERAL:alert, no distress and comfortable ?SKIN: skin color, texture, turgor are normal, no rashes or significant lesions ?EYES: normal, Conjunctiva are pink and non-injected, sclera clear  ?NECK: supple, thyroid normal size, non-tender, without nodularity ?LYMPH:  no palpable lymphadenopathy in the cervical, axillary  ?LUNGS: clear to auscultation and percussion with normal breathing effort ?HEART: regular rate & rhythm and no murmurs and no lower extremity edema ?ABDOMEN:abdomen soft, non-tender and normal bowel sounds ?Musculoskeletal:no cyanosis of digits and no clubbing  ?NEURO: alert & oriented x 3 with fluent speech, no focal motor/sensory deficits ?BREAST: No palpable mass, nodules or adenopathy bilaterally. Breast exam benign.  ? ?LABORATORY DATA:  ?I have reviewed the data as listed ?CBC Latest Ref Rng & Units 04/16/2021 09/23/2020 04/16/2020  ?WBC 4.0 - 10.5 K/uL 5.1 4.3 4.5  ?Hemoglobin 12.0 - 15.0 g/dL 11.9(L) 12.7 11.9(L)  ?Hematocrit 36.0 - 46.0 % 35.5(L) 37.9 36.3  ?Platelets 150 - 400 K/uL 193 202 192  ? ? ? ?CMP Latest Ref Rng & Units 04/16/2021 09/23/2020 04/16/2020  ?Glucose 70 - 99 mg/dL 86 80 80  ?BUN 6 - 20 mg/dL '14 13  10  '$ ?Creatinine 0.44 - 1.00 mg/dL 0.70 0.71 0.73  ?Sodium 135 - 145 mmol/L 139 137 136  ?Potassium 3.5 - 5.1 mmol/L 3.8 4.2 3.4(L)  ?Chloride 98 - 111 mmol/L 108 107 106  ?CO2 22 - 32 mmol/L '25 24 25  '$ ?Calcium 8.9 - 10.3 mg/dL 9.2 9.2 8.7(L)  ?Total Protein 6.5 - 8.1 g/dL 6.9 6.8 6.6  ?Total Bilirubin 0.3 - 1.2 mg/dL 0.4 0.4 0.5  ?Alkaline Phos 38 - 126 U/L 53 - 54  ?AST 15 - 41 U/L '20 16 19  '$ ?ALT 0 - 44 U/L '13 7 17  '$ ? ? ? ? ?RADIOGRAPHIC STUDIES: ?I have personally reviewed the radiological images as listed and agreed with the findings in the report. ?No results found.  ? ? ?Orders Placed This Encounter  ?Procedures  ? MM Digital Screening  ?  Standing Status:   Future  ?  Standing Expiration Date:   04/16/2022  ?  Order Specific Question:   Reason for Exam (SYMPTOM  OR DIAGNOSIS REQUIRED)  ?  Answer:  screening  ?  Order Specific Question:   Is the patient pregnant?  ?  Answer:   No  ?  Order Specific Question:   Preferred imaging location?  ?  Answer:   [facility]  ? MR BREAST BILATERAL W WO CONTRAST INC CAD  ?  Standing Status:   Future  ?  Order Specific Question:   If indicated for the ordered procedure, I authorize the administration of contrast media per Radiology protocol  ?  Answer:   Yes  ?  Order Specific Question:   What is the patient's sedation requirement?  ?  Answer:   No Sedation  ?  Order Specific Question:   Does the patient have a pacemaker or implanted devices?  ?  Answer:   No  ?  Order Specific Question:   Radiology Contrast Protocol - do NOT remove file path  ?  Answer:   [email or web address]  ?  Order Specific Question:   Preferred imaging location?  ?  Answer:   [facility] (table limit-550lbs)  ? ?All questions were answered. The patient knows to call the clinic with any problems, questions or concerns. No barriers to learning was detected. ?The total time spent in the appointment was 30 minutes. ? ?  ? Truitt Merle, MD ?04/16/2021  ? ?I, Wilburn Mylar, am acting as scribe for Truitt Merle, MD.  ? ?I have reviewed the above documentation for accuracy and completeness, and I agree with the above. ?  ? ? ?

## 2021-04-17 ENCOUNTER — Encounter: Payer: Self-pay | Admitting: Hematology

## 2021-04-19 LAB — FERRITIN: Ferritin: 10 ng/mL — ABNORMAL LOW (ref 11–307)

## 2021-05-03 NOTE — Progress Notes (Signed)
? ?NEW PATIENT ?Date of Service/Encounter:  05/04/21 ?Referring provider: Samuel Bouche, NP ?Primary care provider: Samuel Bouche, NP ? ?Subjective:  ?Sheri [name] is a 51 y.o. female with a PMHx of hypothyroidism, breast cancer status postlumpectomy on tamoxifen, presenting today for evaluation of chronic rhinitis. ?History obtained from: chart review and patient. ?  ?Chronic rhinitis: started as a young child ?Symptoms include:  geographic tongue-not painful, nasal congestion, rhinorrhea, post nasal drainage, sneezing, watery eyes, itchy eyes, and itchy nose  ?Occurs year-round with seasonal flares-Spring and Fall ?Potential triggers: ragweed ?Treatments tried: Flonase, Singulair, Xyzal, Restasis ?Previous allergy testing:  yes-ragweed, never on allergy injections ?History of reflux/heartburn: no ? ?Concern for Food Allergy:  ?Food of concern: shellfish ?History of reaction: lip swelling ?Previous allergy testing yes ?Eats egg, dairy, wheat, soy, fish, peanuts, tree nuts, sesame without reactions ?Carries an epinephrine autoinjector: no ? ?Other allergy screening: ?Asthma: no ?Medication allergy:  erythromycin ?Hymenoptera allergy: no ?Urticaria: no ?Eczema:no ?History of recurrent infections suggestive of immunodeficency: no ? ?Past Medical History: ?Past Medical History:  ?Diagnosis Date  ? Allergy   ? Breast mass, left   ? DCIS (ductal carcinoma in situ)   ? left breast  ? Hypothyroidism   ? Seasonal allergies   ? Sebaceous cyst   ? right buttock  ? Thyroid disease   ? ?Medication List:  ?Current Outpatient Medications  ?Medication Sig Dispense Refill  ? Ferrous Fumarate (IRON) 18 MG TBCR Take 18 mg by mouth 2 (two) times daily.    ? fluticasone (FLONASE) 50 MCG/ACT nasal spray Place 2 sprays into both nostrils daily. 16 g 5  ? levocetirizine (XYZAL) 5 MG tablet TAKE 1 TABLET BY MOUTH EVERY DAY IN THE EVENING 90 tablet 0  ? levothyroxine (SYNTHROID) 88 MCG tablet Take 1 tablet (88 mcg total) by mouth daily.  90 tablet 3  ? montelukast (SINGULAIR) 10 MG tablet TAKE 1 TABLET BY MOUTH EVERYDAY AT BEDTIME 90 tablet 0  ? Multiple Vitamin (MULTIVITAMIN) tablet Take 1 tablet by mouth daily.    ? Polyethyl Glycol-Propyl Glycol (SYSTANE) 0.4-0.3 % SOLN Apply to eye.    ? RESTASIS 0.05 % ophthalmic emulsion 1 drop 2 (two) times daily.    ? tamoxifen (NOLVADEX) 20 MG tablet TAKE 1 TABLET BY MOUTH EVERY DAY 90 tablet 3  ? ?No current facility-administered medications for this visit.  ? ?Known Allergies:  ?Allergies  ?Allergen Reactions  ? Shellfish Allergy Anaphylaxis  ? Shellfish-Derived Products Anaphylaxis  ?  Per  allergy testing   ? Other Swelling  ? Erythromycin Base   ? Erythromycin Other (See Comments)  ?  Other reaction(s): GI UPSET  ? ?Past Surgical History: ?Past Surgical History:  ?Procedure Laterality Date  ? BREAST BIOPSY    ? BREAST EXCISIONAL BIOPSY    ? BREAST LUMPECTOMY WITH RADIOACTIVE SEED LOCALIZATION Left 09/15/2017  ? Procedure: BREAST LUMPECTOMY WITH RADIOACTIVE SEED LOCALIZATION;  Surgeon: Coralie Keens, MD;  Location: [facility];  Service: General;  Laterality: Left;  ? CYST EXCISION Right 09/15/2017  ? Procedure: EXCISION OF 1CM RIGHT BUTTOCK SEBACEOUS CYST;  Surgeon: Coralie Keens, MD;  Location: [facility];  Service: General;  Laterality: Right;  ? WISDOM TOOTH EXTRACTION    ? ?Family History: ?Family History  ?Problem Relation Age of Onset  ? Diabetes Mother   ? Hypertension Father   ? COPD Father   ? Cancer Paternal Aunt 67  ?     breast cancer  ? Cancer  Paternal Aunt 25  ?     breast cancer  ? Cancer Cousin 15  ?     breast cancer  ? Cancer Cousin   ?     breast cancer  ? Cancer Other 35  ?     breast cancer  ? Allergic rhinitis Son   ? Colon cancer Neg Hx   ? Rectal cancer Neg Hx   ? Stomach cancer Neg Hx   ? Angioedema Neg Hx   ? Asthma Neg Hx   ? Eczema Neg Hx   ? Immunodeficiency Neg Hx   ? Urticaria Neg Hx   ? ?Social History: Sheri [name] lives in a home built 24  years ago, no water damage, laminate flooring, carpet in the bedroom, gas heating central AC, dog indoors, no roaches, not using dust mite protection, no smoke exposure.  Works as an Information systems manager, + [employer].  ? ?ROS:  ?All other systems negative except as noted per HPI. ? ?Objective:  ?Blood pressure 108/66, pulse 90, temperature 98.2 ?F (36.8 ?C), temperature source Temporal, resp. rate 16, height 5' 5.35" (1.66 m), weight 139 lb 4.8 oz (63.2 kg), SpO2 100 %. ?Body mass index is 22.93 kg/m?Marland Kitchen ?Physical Exam: ? ?General Appearance:  Alert, cooperative, no distress, appears stated age  ?Head:  Normocephalic, without obvious abnormality, atraumatic  ?Eyes:  Conjunctiva clear, EOM's intact  ?Nose: Nares normal, hypertrophic turbinates, normal mucosa, and no visible anterior polyps  ?Throat: Lips normal, tongue geographic; teeth and gums normal, normal posterior oropharynx  ?Neck: Supple, symmetrical  ?Lungs:   clear to auscultation bilaterally, Respirations unlabored, no coughing  ?Heart:  regular rate and rhythm and no murmur, Appears well perfused  ?Extremities: No edema  ?Skin: Skin color, texture, turgor normal, no rashes or lesions on visualized portions of skin  ?Neurologic: No gross deficits  ? ? ? ?Diagnostics: ?Skin Testing: Environmental allergy panel and select foods ? Adequate controls. ?Results discussed with patient/family. ? Airborne Adult Perc - 05/04/21 1416   ? ? Time Antigen Placed 3016   ? Allergen Manufacturer Lavella Hammock   ? Location Back   ? Number of Test 59   ? 1. Control-Buffer 50% Glycerol Negative   ? 2. Control-Histamine 1 mg/ml 3+   ? 3. Albumin saline Negative   ? 4. [place] 4+   ? 5. [place] 2+   ? 6. [place] 2+   ? 7. Kindred Blue Negative   ? 8. Meadow Fescue 4+   ? 9. Perennial Rye 4+   ? 10. Sweet Vernal 4+   ? 11. Timothy 4+   ? 12. Cocklebur 2+   ? 13. Burweed Marshelder 3+   ? 14. Ragweed, short 4+   ? 15. Ragweed, Giant 3+   ? 16. Plantain,  English 3+   ? 17. Lamb's  Quarters 3+   ? 18. Sheep Sorrell Negative   ? 19. Rough Pigweed 2+   ? 20. Marsh Elder, Rough 3+   ? 21. Mugwort, Common 3+   ? 22. Ash mix 2+   ? 23. Birch mix 2+   ? 24. Beech American 3+   ? 25. Box, Elder 3+   ? 26. Cedar, red Negative   ? 27. Cottonwood, [place] Negative   ? 28. Elm mix Negative   ? 29. Hickory 4+   ? 30. Maple mix 4+   ? 31. Oak, [place] mix 4+   ? 32. Pecan Pollen 4+   ? 33.  Pine mix 3+   ? 34. Sycamore Eastern 3+   ? 35. Walnut, Black Pollen Negative   ? 36. Alternaria alternata Negative   ? 61. Cladosporium Herbarum Negative   ? 38. Aspergillus mix Negative   ? 39. Penicillium mix Negative   ? 40. Bipolaris sorokiniana (Helminthosporium) Negative   ? 41. Drechslera spicifera (Curvularia) 3+   ? 42. Mucor plumbeus 2+   ? 43. Fusarium moniliforme 3+   ? 44. Aureobasidium pullulans (pullulara) Negative   ? 45. Rhizopus oryzae Negative   ? 46. Botrytis cinera Negative   ? 47. Epicoccum nigrum Negative   ? 48. Phoma betae Negative   ? 49. Candida Albicans Negative   ? 50. Trichophyton mentagrophytes Negative   ? 51. Mite, D Farinae  5,000 AU/ml 3+   ? 52. Mite, D Pteronyssinus  5,000 AU/ml 3+   ? 53. Cat Hair 10,000 BAU/ml 2+   ? 54.  Dog Epithelia Negative   ? 55. Mixed Feathers Negative   ? 56. Horse Epithelia Negative   ? 57. Cockroach, German 3+   ? 58. Mouse Negative   ? 59. Tobacco Leaf Negative   ? ?  ?  ? ?  ? ? Food Adult Perc - 05/04/21 1400   ? ? Time Antigen Placed 7680   ? Allergen Manufacturer Lavella Hammock   ? Location Back   ? Number of allergen test 6   ? 8. Shellfish Mix --   24x  ? 25. Shrimp --   22x  ? 26. Crab --   10x  ? 27. Lobster --   11  ? 28. Oyster Negative   ? 29. Scallops Negative   ? ?  ?  ? ?  ? ? ?Allergy testing results were read and interpreted by myself, documented by clinical staff. ? ?Assessment and Plan  ?Patient with chronic rhinitis and conjunctivitis determined to be seasonal and perennial allergic based on today's allergy testing.  She would be a great  candidate for allergy shots and we discussed rapid desensitization with Roslyn Smiling therapy versus traditional build up.  She is going to consider this and call us back if interested. ?Additionally, she remains

## 2021-05-04 ENCOUNTER — Encounter: Payer: Self-pay | Admitting: Internal Medicine

## 2021-05-04 ENCOUNTER — Ambulatory Visit: Payer: BC Managed Care – PPO | Admitting: Internal Medicine

## 2021-05-04 VITALS — BP 108/66 | HR 90 | Temp 98.2°F | Resp 16 | Ht 65.35 in | Wt 139.3 lb

## 2021-05-04 DIAGNOSIS — J310 Chronic rhinitis: Secondary | ICD-10-CM

## 2021-05-04 DIAGNOSIS — R21 Rash and other nonspecific skin eruption: Secondary | ICD-10-CM | POA: Diagnosis not present

## 2021-05-04 DIAGNOSIS — J3089 Other allergic rhinitis: Secondary | ICD-10-CM | POA: Insufficient documentation

## 2021-05-04 DIAGNOSIS — H1013 Acute atopic conjunctivitis, bilateral: Secondary | ICD-10-CM | POA: Diagnosis not present

## 2021-05-04 DIAGNOSIS — T7800XA Anaphylactic reaction due to unspecified food, initial encounter: Secondary | ICD-10-CM

## 2021-05-04 MED ORDER — OLOPATADINE HCL 0.2 % OP SOLN
1.0000 [drp] | Freq: Every day | OPHTHALMIC | 5 refills | Status: DC | PRN
Start: 2021-05-04 — End: 2021-10-27

## 2021-05-04 MED ORDER — EPINEPHRINE 0.3 MG/0.3ML IJ SOAJ: 0.3000 mg | Freq: Once | INTRAMUSCULAR | 2 refills | Status: AC

## 2021-05-04 NOTE — Patient Instructions (Signed)
Chronic Rhinitis - seasonal and perennial allergic: ?- allergy testing today was positive to grasses, weeds, trees, indoor and outdoor molds, dust mite, cat, cockroach ?- allergen avoidance as below ?- consider allergy shots as long term control of your symptoms by teaching your immune system to be more tolerant of your allergy triggers ?- Continue Nasal Steroid Spray: Options include Flonase (fluticasone), Nasocort (triamcinolone), Nasonex (mometasome) 1- 2 sprays in each nostril daily (can buy over-the-counter if not covered by insurance)  Best results if used daily. ?- Continue Singulair (Montelukast) '10mg'$  nightly. ?- Continue over the counter antihistamine daily or daily as needed.   ?-Your options include Zyrtec (Cetirizine) '10mg'$ , Claritin (Loratadine) '10mg'$ , Allegra (Fexofenadine) '180mg'$ , or Xyzal (Levocetirinze) '5mg'$  ? ?Allergic Conjunctivitis:  ?- Consider Allergy Eye drops: great options include Pataday (Olopatadine) or Zaditor (ketotifen) for eye symptoms daily as needed-both sold over the counter if not covered by insurance.   ?-Continue Systane rewetting drops as needed ?-Avoid eye drops that say red eye relief as they may contain medications that dry out your eyes. ? ?Food allergy (shellfish):  ?- today's skin testing was positive to shellfish mix, shrimp, crab, lobster ?- please strictly avoid all shellfish ?- for SKIN only reaction, okay to take Benadryl 2 capsules every 4 hours ?- for SKIN + ANY additional symptoms, OR IF concern for LIFE THREATENING reaction = Epipen Autoinjector EpiPen 0.3 mg. ?- If using Epinephrine autoinjector, call 911 ?- A food allergy action plan has been provided and discussed. ?- Medic Alert identification is recommended. ? ?Follow-up in 3 months, sooner if needed.  If you decide to move forward with allergy shots, call to make that appointment.  Let the front staff know if you want to do rapid desensitization (Rush protocol) or traditional buildup. ?It was a pleasure meeting  you today! ? ?Reducing Pollen Exposure ? ?The American Academy of Allergy, Asthma and Immunology suggests the following steps to reduce your exposure to pollen during allergy seasons. ?   ?Do not hang sheets or clothing out to dry; pollen may collect on these items. ?Do not mow lawns or spend time around freshly cut grass; mowing stirs up pollen. ?Keep windows closed at night.  Keep car windows closed while driving. ?Minimize morning activities outdoors, a time when pollen counts are usually at their highest. ?Stay indoors as much as possible when pollen counts or humidity is high and on windy days when pollen tends to remain in the air longer. ?Use air conditioning when possible.  Many air conditioners have filters that trap the pollen spores. ?Use a HEPA room air filter to remove pollen form the indoor air you breathe. ? ?Control of Mold Allergen  ? ?Mold and fungi can grow on a variety of surfaces provided certain temperature and moisture conditions exist.  Outdoor molds grow on plants, decaying vegetation and soil.  The major outdoor mold, Alternaria and Cladosporium, are found in very high numbers during hot and dry conditions.  Generally, a late Summer - Fall peak is seen for common outdoor fungal spores.  Rain will temporarily lower outdoor mold spore count, but counts rise rapidly when the rainy period ends.  The most important indoor molds are Aspergillus and Penicillium.  Dark, humid and poorly ventilated basements are ideal sites for mold growth.  The next most common sites of mold growth are the bathroom and the kitchen. ? ?Outdoor (Seasonal) Mold Control ? ?Use air conditioning and keep windows closed ?Avoid exposure to decaying vegetation. ?Avoid leaf raking. ?Avoid grain  handling. ?Consider wearing a face mask if working in moldy areas.  ? ? ?Indoor (Perennial) Mold Control  ? ?Maintain humidity below 50%. ?Clean washable surfaces with 5% bleach solution. ?Remove sources e.g. contaminated  carpets. ? ? ?DUST MITE AVOIDANCE MEASURES: ? ?There are three main measures that need and can be taken to avoid house dust mites: ? ?Reduce accumulation of dust in general ?-reduce furniture, clothing, carpeting, books, stuffed animals, especially in bedroom ? ?Separate yourself from the dust ?-use pillow and mattress encasements (can be found at stores such as [employer] or online) ?-avoid direct exposure to air condition flow ?-use a HEPA filter device, especially in the bedroom; you can also use a HEPA filter vacuum cleaner ?-wipe dust with a moist towel instead of a dry towel or broom when cleaning ? ?Decrease mites and/or their secretions ?-wash clothing and linen and stuffed animals at highest temperature possible, at least every 2 weeks ?-stuffed animals can also be placed in a bag and put in a freezer overnight ? ?Despite the above measures, it is impossible to eliminate dust mites or their allergen completely from your home.  With the above measures the burden of mites in your home can be diminished, with the goal of minimizing your allergic symptoms.  Success will be reached only when implementing and using all means together. ? ?Control of Dog or Cat Allergen ? ?Avoidance is the best way to manage a dog or cat allergy. If you have a dog or cat and are allergic to dog or cats, consider removing the dog or cat from the home. ?If you have a dog or cat but don?t want to find it a new home, or if your family wants a pet even though someone in the household is allergic, here are some strategies that may help keep symptoms at bay: ? ?Keep the pet out of your bedroom and restrict it to only a few rooms. Be advised that keeping the dog or cat in only one room will not limit the allergens to that room. ?Don?t pet, hug or kiss the dog or cat; if you do, wash your hands with soap and water. ?High-efficiency particulate air (HEPA) cleaners run continuously in a bedroom or living room can reduce allergen  levels over time. ?Regular use of a high-efficiency vacuum cleaner or a central vacuum can reduce allergen levels. ?Giving your dog or cat a bath at least once a week can reduce airborne allergen. ? ?Control of Cockroach Allergen ? ?Cockroach allergen has been identified as an important cause of acute attacks of asthma, especially in urban settings.  There are fifty-five species of cockroach that exist in the [place], however only three, the [place], [place] species produce allergen that can affect patients with Asthma.  Allergens can be obtained from fecal particles, egg casings and secretions from cockroaches. ?   ?Remove food sources. ?Reduce access to water. ?Seal access and entry points. ?Spray runways with 0.5-1% Diazinon or Chlorpyrifos ?Blow boric acid power under stoves and refrigerator. ?Place bait stations (hydramethylnon) at feeding sites. ? ? ?

## 2021-05-05 ENCOUNTER — Other Ambulatory Visit: Payer: Self-pay

## 2021-05-05 MED ORDER — EPINEPHRINE 0.3 MG/0.3ML IJ SOAJ: 0.3000 mg | Freq: Once | INTRAMUSCULAR | 1 refills | Status: AC

## 2021-05-06 ENCOUNTER — Telehealth: Payer: Self-pay | Admitting: *Deleted

## 2021-05-06 NOTE — Telephone Encounter (Signed)
Your information has been submitted to [facility]. [employer] [place] will review the request and notify you of the determination decision directly, typically within 72 hours of receiving all information. ? ?You will also receive your request decision electronically. To check for an update later, open this request again from your dashboard. ? ?If [employer] [facility] has not responded within the specified timeframe or if you have any questions about your PA submission, contact [facility] directly at [phone]. ? ?Submitted for Epinephrine 0.3 mg.  ?

## 2021-05-10 NOTE — Telephone Encounter (Signed)
Denied on April 7. Called [facility]- It is ready for her $10 copay.  ?

## 2021-06-10 ENCOUNTER — Other Ambulatory Visit: Payer: Self-pay | Admitting: Family Medicine

## 2021-06-10 ENCOUNTER — Other Ambulatory Visit: Payer: Self-pay | Admitting: Hematology

## 2021-06-11 ENCOUNTER — Other Ambulatory Visit: Payer: Self-pay | Admitting: Family Medicine

## 2021-08-04 NOTE — Progress Notes (Deleted)
FOLLOW UP Date of Service/Encounter:  08/04/21   Subjective:  Sheri [name] (DOB: 1970-02-13) is a 51 y.o. female  PMHx of hypothyroidism, breast cancer status postlumpectomy on tamoxifen who returns to the [facility] on 08/05/2021 in re-evaluation of the following: allergic rhinitis, shellfish allergy History obtained from: chart review and {Persons; PED relatives w/patient:[id number]::"patient"}.  For Review, LV was on 05/04/21  with Dr.Whitley Patchen seen for intial visit for shellfish allergy, allergic rhinitis .We discussed allergy injections and continued xyzal, singulair, INCS and recommended allergy eye drops.   We also gave her updated epipen.   Pertinent History/Diagnostics:  - Allergic Rhinitis:   - SPT environmental panel (05/04/21): positive to grasses, weeds, trees, indoor and outdoor molds, dust mite, cat, cockroach - Food Allergy (shellfish): tolerates fish  - Hx of reaction: lip swelling  - SPT select foods (05/04/21): positive to shellfish mix, shrimp, crab, lobster   Today presents for follow-up. ***  Allergies as of 08/05/2021       Reactions   Shellfish Allergy Anaphylaxis   Shellfish-derived Products Anaphylaxis   Per  allergy testing    Other Swelling   Erythromycin Base    Erythromycin Other (See Comments)   Other reaction(s): GI UPSET        Medication List        Accurate as of August 04, 2021  9:03 AM. If you have any questions, ask your nurse or doctor.          fluticasone 50 MCG/ACT nasal spray Commonly known as: FLONASE Place 2 sprays into both nostrils daily.   Iron 18 MG Tbcr Take 18 mg by mouth 2 (two) times daily.   levocetirizine 5 MG tablet Commonly known as: XYZAL TAKE 1 TABLET BY MOUTH EVERY DAY IN THE EVENING   levothyroxine 88 MCG tablet Commonly known as: Synthroid Take 1 tablet (88 mcg total) by mouth daily.   montelukast 10 MG tablet Commonly known as: SINGULAIR TAKE 1 TABLET BY MOUTH EVERYDAY AT BEDTIME    multivitamin tablet Take 1 tablet by mouth daily.   Olopatadine HCl 0.2 % Soln Commonly known as: Pataday Place 1 drop into both eyes daily as needed.   Restasis 0.05 % ophthalmic emulsion Generic drug: cycloSPORINE 1 drop 2 (two) times daily.   Systane 0.4-0.3 % Soln Generic drug: Polyethyl Glycol-Propyl Glycol Apply to eye.   tamoxifen 20 MG tablet Commonly known as: NOLVADEX TAKE 1 TABLET BY MOUTH EVERY DAY       Past Medical History:  Diagnosis Date   Allergy    Breast mass, left    DCIS (ductal carcinoma in situ)    left breast   Hypothyroidism    Seasonal allergies    Sebaceous cyst    right buttock   Thyroid disease    Past Surgical History:  Procedure Laterality Date   BREAST BIOPSY     BREAST EXCISIONAL BIOPSY     BREAST LUMPECTOMY WITH RADIOACTIVE SEED LOCALIZATION Left 09/15/2017   Procedure: BREAST LUMPECTOMY WITH RADIOACTIVE SEED LOCALIZATION;  Surgeon: Coralie Keens, MD;  Location: [facility];  Service: General;  Laterality: Left;   CYST EXCISION Right 09/15/2017   Procedure: EXCISION OF 1CM RIGHT BUTTOCK SEBACEOUS CYST;  Surgeon: Coralie Keens, MD;  Location: [facility];  Service: General;  Laterality: Right;   WISDOM TOOTH EXTRACTION     Otherwise, there have been no changes to her past medical history, surgical history, family history, or social history.  ROS:  All others negative except as noted per HPI.   Objective:  There were no vitals taken for this visit. There is no height or weight on file to calculate BMI. Physical Exam: General Appearance:  Alert, cooperative, no distress, appears stated age  Head:  Normocephalic, without obvious abnormality, atraumatic  Eyes:  Conjunctiva clear, EOM's intact  Nose: Nares normal, {Blank multiple:19196:a:"***","hypertrophic turbinates","normal mucosa","no visible anterior polyps","septum midline"}  Throat: Lips, tongue normal; teeth and gums normal, {Blank  multiple:19196:a:"***","normal posterior oropharynx","tonsils 2+","tonsils 3+","no tonsillar exudate","+ cobblestoning"}  Neck: Supple, symmetrical  Lungs:   {Blank multiple:19196:a:"***","clear to auscultation bilaterally","end-expiratory wheezing","wheezing throughout"}, Respirations unlabored, {Blank multiple:19196:a:"***","no coughing","intermittent dry coughing"}  Heart:  {Blank multiple:19196:a:"***","regular rate and rhythm","no murmur"}, Appears well perfused  Extremities: No edema  Skin: Skin color, texture, turgor normal, no rashes or lesions on visualized portions of skin  Neurologic: No gross deficits   Reviewed: ***  Spirometry:  Tracings reviewed. Her effort: {Blank single:19197::"Good reproducible efforts.","It was hard to get consistent efforts and there is a question as to whether this reflects a maximal maneuver.","Poor effort, data can not be interpreted.","Variable effort-results affected.","decent for first attempt at spirometry."} FVC: ***L FEV1: ***L, ***% predicted FEV1/FVC ratio: ***% Interpretation: {Blank single:19197::"Spirometry consistent with mild obstructive disease","Spirometry consistent with moderate obstructive disease","Spirometry consistent with severe obstructive disease","Spirometry consistent with possible restrictive disease","Spirometry consistent with mixed obstructive and restrictive disease","Spirometry uninterpretable due to technique","Spirometry consistent with normal pattern","No overt abnormalities noted given today's efforts"}.  Please see scanned spirometry results for details.  Skin Testing: {Blank single:19197::"Select foods","Environmental allergy panel","Environmental allergy panel and select foods","Food allergy panel","None","Deferred due to recent antihistamines use","deferred due to recent reaction"}. ***Adequate positive and negative controls Results discussed with patient/family.   {Blank single:19197::"Allergy testing results were  read and interpreted by myself, documented by clinical staff."," "}  Assessment/Plan   ***  Sigurd Sos, MD  [facility]

## 2021-08-05 ENCOUNTER — Ambulatory Visit: Payer: BC Managed Care – PPO | Admitting: Internal Medicine

## 2021-08-09 ENCOUNTER — Encounter: Payer: Self-pay | Admitting: Medical-Surgical

## 2021-09-09 DIAGNOSIS — Z6823 Body mass index (BMI) 23.0-23.9, adult: Secondary | ICD-10-CM | POA: Diagnosis not present

## 2021-09-09 DIAGNOSIS — Z124 Encounter for screening for malignant neoplasm of cervix: Secondary | ICD-10-CM | POA: Diagnosis not present

## 2021-09-09 DIAGNOSIS — Z01419 Encounter for gynecological examination (general) (routine) without abnormal findings: Secondary | ICD-10-CM | POA: Diagnosis not present

## 2021-09-24 ENCOUNTER — Encounter: Payer: BC Managed Care – PPO | Admitting: Medical-Surgical

## 2021-09-30 DIAGNOSIS — Z853 Personal history of malignant neoplasm of breast: Secondary | ICD-10-CM | POA: Diagnosis not present

## 2021-10-18 DIAGNOSIS — Z13820 Encounter for screening for osteoporosis: Secondary | ICD-10-CM | POA: Diagnosis not present

## 2021-10-20 ENCOUNTER — Encounter: Payer: Self-pay | Admitting: Medical-Surgical

## 2021-10-27 ENCOUNTER — Encounter: Payer: Self-pay | Admitting: Medical-Surgical

## 2021-10-27 ENCOUNTER — Ambulatory Visit (INDEPENDENT_AMBULATORY_CARE_PROVIDER_SITE_OTHER): Payer: BC Managed Care – PPO | Admitting: Medical-Surgical

## 2021-10-27 DIAGNOSIS — E890 Postprocedural hypothyroidism: Secondary | ICD-10-CM

## 2021-10-27 DIAGNOSIS — Z0000 Encounter for general adult medical examination without abnormal findings: Secondary | ICD-10-CM | POA: Diagnosis not present

## 2021-10-27 DIAGNOSIS — E039 Hypothyroidism, unspecified: Secondary | ICD-10-CM

## 2021-10-27 DIAGNOSIS — E611 Iron deficiency: Secondary | ICD-10-CM

## 2021-10-27 DIAGNOSIS — Z0001 Encounter for general adult medical examination with abnormal findings: Secondary | ICD-10-CM

## 2021-10-27 DIAGNOSIS — R87619 Unspecified abnormal cytological findings in specimens from cervix uteri: Secondary | ICD-10-CM

## 2021-10-27 DIAGNOSIS — E0500 Thyrotoxicosis with diffuse goiter without thyrotoxic crisis or storm: Secondary | ICD-10-CM

## 2021-10-27 HISTORY — DX: Unspecified abnormal cytological findings in specimens from cervix uteri: R87.619

## 2021-10-27 HISTORY — DX: Thyrotoxicosis with diffuse goiter without thyrotoxic crisis or storm: E05.00

## 2021-10-27 MED ORDER — EPINEPHRINE 0.3 MG/0.3ML IJ SOAJ: 0.3000 mg | INTRAMUSCULAR | Status: DC | PRN

## 2021-10-27 NOTE — Progress Notes (Signed)
Complete physical exam  Patient: Sheri [name]   DOB: [birth year]   51 y.o. Female  MRN: [id number]  Subjective:    Chief Complaint  Patient presents with   Annual Exam   ELOYCE BULTMAN is a 51 y.o. female who presents today for a complete physical exam. She reports consuming a general diet. Home exercise routine includes treadmill desk, walking track, strength training. She generally feels well. She reports sleeping fairly well. She does not have additional problems to discuss today.   Most recent fall risk assessment:    03/26/2021    2:19 PM  [place] in the past year? 0  Number falls in past yr: 0  Injury with Fall? 0  Risk for fall due to : No Fall Risks  Follow up Falls evaluation completed     Most recent depression screenings:    10/27/2021    9:46 AM 03/26/2021    2:19 PM  PHQ 2/9 Scores  PHQ - 2 Score 0 0    Vision:Within last year, Dental: No current dental problems and Receives regular dental care, and STD: The patient denies history of sexually transmitted disease.    Patient Care Team: Samuel Bouche, NP as PCP - General (Nurse Practitioner) Coralie Keens, MD as Consulting Physician (General Surgery) Truitt Merle, MD as Consulting Physician (Hematology)   Outpatient Medications Prior to Visit  Medication Sig   Bacillus Coagulans-Inulin (ALIGN PREBIOTIC-PROBIOTIC PO) Take by mouth.   calcium carbonate (CALCIUM 600) 600 MG TABS tablet Take 600 mg by mouth 2 (two) times daily with a meal.   Cholecalciferol (VITAMIN D) 50 MCG (2000 UT) CAPS Take by mouth.   fluticasone (FLONASE) 50 MCG/ACT nasal spray Place 2 sprays into both nostrils daily.   levocetirizine (XYZAL) 5 MG tablet TAKE 1 TABLET BY MOUTH EVERY DAY IN THE EVENING   levothyroxine (SYNTHROID) 88 MCG tablet Take 1 tablet (88 mcg total) by mouth daily.   Polyethyl Glycol-Propyl Glycol (SYSTANE) 0.4-0.3 % SOLN Apply to eye.   RESTASIS 0.05 % ophthalmic emulsion 1 drop 2 (two) times  daily.   tamoxifen (NOLVADEX) 20 MG tablet TAKE 1 TABLET BY MOUTH EVERY DAY   [DISCONTINUED] Ferrous Fumarate (IRON) 18 MG TBCR Take 18 mg by mouth 2 (two) times daily.   [DISCONTINUED] montelukast (SINGULAIR) 10 MG tablet TAKE 1 TABLET BY MOUTH EVERYDAY AT BEDTIME   [DISCONTINUED] Multiple Vitamin (MULTIVITAMIN) tablet Take 1 tablet by mouth daily.   [DISCONTINUED] Olopatadine HCl (PATADAY) 0.2 % SOLN Place 1 drop into both eyes daily as needed.   No facility-administered medications prior to visit.    Review of Systems  Constitutional:  Negative for chills, fever, malaise/fatigue and weight loss.  HENT:  Negative for congestion, ear pain, hearing loss, sinus pain and sore throat.   Eyes:  Negative for blurred vision, photophobia and pain.  Respiratory:  Negative for cough, shortness of breath and wheezing.   Cardiovascular:  Negative for chest pain, palpitations and leg swelling.  Gastrointestinal:  Negative for abdominal pain, constipation, diarrhea, heartburn, nausea and vomiting.  Genitourinary:  Negative for dysuria, frequency and urgency.  Musculoskeletal:  Negative for falls and neck pain.  Skin:  Negative for itching and rash.  Neurological:  Negative for dizziness, weakness and headaches.  Endo/Heme/Allergies:  Negative for polydipsia. Does not bruise/bleed easily.  Psychiatric/Behavioral:  Negative for depression, substance abuse and suicidal ideas. The patient has insomnia (sleep maintenance issues). The patient is not nervous/anxious.  Objective:     There were no vitals taken for this visit.   Physical Exam Constitutional:      General: She is not in acute distress.    Appearance: Normal appearance. She is not ill-appearing.  HENT:     Head: Normocephalic and atraumatic.     Right Ear: Tympanic membrane, ear canal and external ear normal. There is no impacted cerumen.     Left Ear: Tympanic membrane, ear canal and external ear normal. There is no impacted  cerumen.     Nose: Nose normal.     Mouth/Throat:     Mouth: Mucous membranes are moist.     Pharynx: No oropharyngeal exudate or posterior oropharyngeal erythema.  Eyes:     General: No scleral icterus.       Right eye: No discharge.        Left eye: No discharge.     Extraocular Movements: Extraocular movements intact.     Conjunctiva/sclera: Conjunctivae normal.     Pupils: Pupils are equal, round, and reactive to light.  Neck:     Thyroid: No thyromegaly.     Vascular: No carotid bruit or JVD.     Trachea: Trachea normal.  Cardiovascular:     Rate and Rhythm: Normal rate and regular rhythm.     Pulses: Normal pulses.     Heart sounds: Normal heart sounds. No murmur heard.    No friction rub. No gallop.  Pulmonary:     Effort: Pulmonary effort is normal. No respiratory distress.     Breath sounds: Normal breath sounds. No wheezing.  Abdominal:     General: Bowel sounds are normal. There is no distension.     Palpations: Abdomen is soft.     Tenderness: There is no abdominal tenderness. There is no guarding.  Musculoskeletal:        General: Normal range of motion.     Cervical back: Normal range of motion and neck supple.  Lymphadenopathy:     Cervical: No cervical adenopathy.  Skin:    General: Skin is warm and dry.  Neurological:     Mental Status: She is alert and oriented to person, place, and time.     Cranial Nerves: No cranial nerve deficit.  Psychiatric:        Mood and Affect: Mood normal.        Behavior: Behavior normal.        Thought Content: Thought content normal.        Judgment: Judgment normal.   No results found for any visits on 10/27/21.     Assessment & Plan:    Routine Health Maintenance and Physical Exam  Immunization History  Administered Date(s) Administered   Influenza Split 03/20/2012   Influenza,inj,Quad PF,6+ Mos 10/31/2012, 11/05/2016   Influenza-Unspecified 11/09/2017, 11/08/2018, 11/01/2019, 11/11/2020, 10/20/2021    PFIZER(Purple Top)SARS-COV-2 Vaccination 10/22/2020   Td 07/02/2002   Tdap 05/26/2016   Zoster Recombinat (Shingrix) 04/23/2021, 08/05/2021    Health Maintenance  Topic Date Due   HIV Screening  Never done   Hepatitis C Screening  Never done   COVID-19 Vaccine (2 - Pfizer risk series) 11/12/2021 (Originally 11/12/2020)   MAMMOGRAM  12/24/2022   PAP SMEAR-Modifier  09/08/2023   TETANUS/TDAP  05/27/2026   COLONOSCOPY (Pts 45-84yr Insurance coverage will need to be confirmed)  05/21/2029   INFLUENZA VACCINE  Completed   Zoster Vaccines- Shingrix  Completed   HPV VACCINES  Aged Out   Discussed health benefits of  physical activity, and encouraged her to engage in regular exercise appropriate for her age and condition.  1. Annual physical exam Checking labs as below. UTD on preventative care. Wellness information provided with AVS.  - CBC with Differential/Platelet - COMPLETE METABOLIC PANEL WITH GFR - Lipid Panel w/reflex Direct LDL  2. Postablative hypothyroidism Checking TSH. Continue levothyroxine 11mg daily, titrate depending on results.  - TSH  4. Iron deficiency Checking iron panel.  - Fe+TIBC+Fer  Return in about 1 year (around 10/28/2022) for annual physical exam or sooner if needed.   JSamuel Bouche NP

## 2021-10-28 LAB — CBC WITH DIFFERENTIAL/PLATELET
Absolute Monocytes: 274 cells/uL (ref 200–950)
Basophils Absolute: 22 cells/uL (ref 0–200)
Basophils Relative: 0.6 %
Eosinophils Absolute: 22 cells/uL (ref 15–500)
Eosinophils Relative: 0.6 %
HCT: 38.8 % (ref 35.0–45.0)
Hemoglobin: 12.8 g/dL (ref 11.7–15.5)
Lymphs Abs: 1825 cells/uL (ref 850–3900)
MCH: 27.9 pg (ref 27.0–33.0)
MCHC: 33 g/dL (ref 32.0–36.0)
MCV: 84.7 fL (ref 80.0–100.0)
MPV: 12.2 fL (ref 7.5–12.5)
Monocytes Relative: 7.6 %
Neutro Abs: 1458 cells/uL — ABNORMAL LOW (ref 1500–7800)
Neutrophils Relative %: 40.5 %
Platelets: 215 10*3/uL (ref 140–400)
RBC: 4.58 10*6/uL (ref 3.80–5.10)
RDW: 13.8 % (ref 11.0–15.0)
Total Lymphocyte: 50.7 %
WBC: 3.6 10*3/uL — ABNORMAL LOW (ref 3.8–10.8)

## 2021-10-28 LAB — COMPLETE METABOLIC PANEL WITH GFR
AG Ratio: 1.6 (calc) (ref 1.0–2.5)
ALT: 10 U/L (ref 6–29)
AST: 19 U/L (ref 10–35)
Albumin: 4.2 g/dL (ref 3.6–5.1)
Alkaline phosphatase (APISO): 48 U/L (ref 37–153)
BUN: 13 mg/dL (ref 7–25)
CO2: 25 mmol/L (ref 20–32)
Calcium: 9.1 mg/dL (ref 8.6–10.4)
Chloride: 107 mmol/L (ref 98–110)
Creat: 0.82 mg/dL (ref 0.50–1.03)
Globulin: 2.6 g/dL (calc) (ref 1.9–3.7)
Glucose, Bld: 76 mg/dL (ref 65–99)
Potassium: 4.8 mmol/L (ref 3.5–5.3)
Sodium: 140 mmol/L (ref 135–146)
Total Bilirubin: 0.4 mg/dL (ref 0.2–1.2)
Total Protein: 6.8 g/dL (ref 6.1–8.1)
eGFR: 87 mL/min/{1.73_m2} (ref >=60)

## 2021-10-28 LAB — IRON,TIBC AND FERRITIN PANEL
%SAT: 10 % (calc) — ABNORMAL LOW (ref 16–45)
Ferritin: 9 ng/mL — ABNORMAL LOW (ref 16–232)
Iron: 44 ug/dL — ABNORMAL LOW (ref 45–160)
TIBC: 429 mcg/dL (calc) (ref 250–450)

## 2021-10-28 LAB — LIPID PANEL W/REFLEX DIRECT LDL
Cholesterol: 214 mg/dL — ABNORMAL HIGH (ref <200)
HDL: 105 mg/dL (ref >=50)
LDL Cholesterol (Calc): 95 mg/dL (calc)
Non-HDL Cholesterol (Calc): 109 mg/dL (calc) (ref <130)
Total CHOL/HDL Ratio: 2 (calc) (ref <5.0)
Triglycerides: 57 mg/dL (ref <150)

## 2021-10-28 LAB — TSH: TSH: 0.81 mIU/L

## 2021-12-27 ENCOUNTER — Ambulatory Visit
Admission: RE | Admit: 2021-12-27 | Discharge: 2021-12-27 | Disposition: A | Discharge status: Home / Self Care | Payer: BC Managed Care – PPO | Source: Ambulatory Visit | Attending: Hematology | Admitting: Hematology

## 2021-12-27 DIAGNOSIS — Z1231 Encounter for screening mammogram for malignant neoplasm of breast: Secondary | ICD-10-CM

## 2022-03-26 ENCOUNTER — Other Ambulatory Visit: Payer: Self-pay | Admitting: Medical-Surgical

## 2022-04-17 NOTE — Progress Notes (Unsigned)
Patient Care Team: Samuel Bouche, NP as PCP - General (Nurse Practitioner) Coralie Keens, MD as Consulting Physician (General Surgery) Truitt Merle, MD as Consulting Physician (Hematology)   CHIEF COMPLAINT: Follow up LCIS left breast   Oncology History  Lobular carcinoma in situ (LCIS) of left breast  08/15/2017 Initial Biopsy   Diagnosis Breast, left, needle core biopsy, upper outer quadrant - LOBULAR NEOPLASIA (ATYPICAL LOBULAR HYPERPLASIA). - FIBROCYSTIC CHANGE AND ADENOSIS WITH CALCIFICATIONS. - PSEUDOANGIOMATOUS STROMAL HYPERPLASIA ([place]).   09/15/2017 Pathology Results   Diagnosis Breast, lumpectomy, left - LOBULAR NEOPLASIA (LOBULAR CARCINOMA IN SITU). - HEALING BIOPSY SITE. - SEE COMMENT.   10/07/2017 Initial Diagnosis   Lobular carcinoma in situ (LCIS) of left breast   10/10/2018 -  Anti-estrogen oral therapy   Tamoxifen started 10/09/2017      CURRENT THERAPY: Tamoxifen 20 mg daily, starting 10/09/17  INTERVAL HISTORY Ms. Razzak returns for follow up as scheduled. Last seen by Dr. Burr Medico 04/16/21. Mammogram 12/2021 benign; MRI scheduled in 06/2022. Continues tamoxifen, tolerating well overall.  Denies concerns in her breast such as new lump/mass, nipple discharge or inversion, or skin change.  Skipped a period for 2 months but restarted, only heavy on day 1 now.  Been taking oral iron since 10/2021.  ROS  All other systems reviewed and negative  Past Medical History:  Diagnosis Date   Abnormal cervical Papanicolaou smear 10/27/2021   20 YEARS AGO   Allergy    Breast mass, left    DCIS (ductal carcinoma in situ)    left breast   Eye abnormality 05/27/2016   Seems a bit young for arcus senilis but possible limbus sign? Await calcium levels though apparently these can be normal. Ophthalmology notes reviewed the very seventh 2018: Corneal arcus, vision concerns include myopia, astigmatism, presbyopia,   Graves' disease 10/27/2021   Hypothyroidism    Seasonal allergies     Sebaceous cyst    right buttock   Thyroid disease      Past Surgical History:  Procedure Laterality Date   BREAST BIOPSY     BREAST EXCISIONAL BIOPSY     BREAST LUMPECTOMY WITH RADIOACTIVE SEED LOCALIZATION Left 09/15/2017   Procedure: BREAST LUMPECTOMY WITH RADIOACTIVE SEED LOCALIZATION;  Surgeon: Coralie Keens, MD;  Location: [facility];  Service: General;  Laterality: Left;   CYST EXCISION Right 09/15/2017   Procedure: EXCISION OF 1CM RIGHT BUTTOCK SEBACEOUS CYST;  Surgeon: Coralie Keens, MD;  Location: [facility];  Service: General;  Laterality: Right;   WISDOM TOOTH EXTRACTION       Outpatient Encounter Medications as of 04/18/2022  Medication Sig   Bacillus Coagulans-Inulin (ALIGN PREBIOTIC-PROBIOTIC PO) Take by mouth.   calcium carbonate (CALCIUM 600) 600 MG TABS tablet Take 600 mg by mouth 2 (two) times daily with a meal.   Cholecalciferol (VITAMIN D) 50 MCG (2000 UT) CAPS Take by mouth.   EPINEPHrine (EPIPEN 2-PAK) 0.3 mg/0.3 mL IJ SOAJ injection Inject 0.3 mg into the muscle as needed for anaphylaxis.   ferrous sulfate 325 (65 FE) MG tablet Take 325 mg by mouth daily with breakfast.   fluticasone (FLONASE) 50 MCG/ACT nasal spray Place 2 sprays into both nostrils daily.   levocetirizine (XYZAL) 5 MG tablet TAKE 1 TABLET BY MOUTH EVERY DAY IN THE EVENING   levothyroxine (SYNTHROID) 88 MCG tablet Take 1 tablet (88 mcg total) by mouth daily.   Polyethyl Glycol-Propyl Glycol (SYSTANE) 0.4-0.3 % SOLN Apply to eye.   RESTASIS 0.05 %  ophthalmic emulsion 1 drop 2 (two) times daily.   tamoxifen (NOLVADEX) 20 MG tablet TAKE 1 TABLET BY MOUTH EVERY DAY   No facility-administered encounter medications on file as of 04/18/2022.     Today's Vitals   04/18/22 1020  BP: 107/72  Pulse: 98  Resp: 14  Temp: 98.2 F (36.8 C)  TempSrc: Oral  SpO2: 100%  Weight: 137 lb 3.2 oz (62.2 kg)   Body mass index is 22.58 kg/m.   PHYSICAL EXAM GENERAL:alert,  no distress and comfortable SKIN: no rash  EYES: sclera clear NECK: without mass LYMPH:  no palpable cervical or supraclavicular lymphadenopathy  LUNGS: clear with normal breathing effort HEART: regular rate & rhythm, no lower extremity edema ABDOMEN: abdomen soft, non-tender and normal bowel sounds NEURO: alert & oriented x 3 with fluent speech, no focal motor/sensory deficits Breast exam: Symmetric without nipple discharge or inversion.  S/p left lumpectomy, incisions completely healed with minimal scar tissue.  No palpable mass or nodularity in either breast or axilla that I could appreciate   CBC    Component Value Date/Time   WBC 3.8 (L) 04/18/2022 1002   WBC 3.6 (L) 10/27/2021 0000   RBC 4.06 04/18/2022 1002   HGB 12.6 04/18/2022 1002   HCT 37.5 04/18/2022 1002   PLT 198 04/18/2022 1002   MCV 92.4 04/18/2022 1002   MCH 31.0 04/18/2022 1002   MCHC 33.6 04/18/2022 1002   RDW 12.9 04/18/2022 1002   LYMPHSABS 1.5 04/18/2022 1002   MONOABS 0.2 04/18/2022 1002   EOSABS 0.0 04/18/2022 1002   BASOSABS 0.0 04/18/2022 1002     CMP     Component Value Date/Time   NA 140 04/18/2022 1002   K 3.8 04/18/2022 1002   CL 107 04/18/2022 1002   CO2 29 04/18/2022 1002   GLUCOSE 95 04/18/2022 1002   BUN 13 04/18/2022 1002   CREATININE 0.73 04/18/2022 1002   CREATININE 0.82 10/27/2021 0000   CALCIUM 9.0 04/18/2022 1002   PROT 6.3 (L) 04/18/2022 1002   ALBUMIN 3.8 04/18/2022 1002   AST 16 04/18/2022 1002   ALT 12 04/18/2022 1002   ALKPHOS 44 04/18/2022 1002   BILITOT 0.4 04/18/2022 1002   GFRNONAA >60 04/18/2022 1002   GFRNONAA 103 04/17/2019 1000   GFRAA >60 04/17/2019 1405   GFRAA 119 04/17/2019 1000     ASSESSMENT & PLAN:Guadalupe G Califf is a 52 y.o. female with    1. Lobular carcinoma in situ of left breast  -Diagnosed in 10/2017. S/p left lumpectomy -Began chemoprevention with Tamoxifen tin 10/2017; goal 5 years  -On High risk screening program with annual mammograms  and breast MRIs staggered 6 months apart  -Ms. Nissen is clinically doing well.  Tolerating tamoxifen with minimal side effects.  We discussed recent data showing efficacy of low-dose tamoxifen for 3 years.  She would like to complete 5 years this September, we discussed taking 10 mg which she will consider. -Breast exam is unremarkable, labs are stable. -Continue high risk screening, next MRI 06/2022 then mammogram 12/2022  2.  Age-appropriate health maintenance -Continue healthy active lifestyle and age-appropriate health maintenance -Iron panel 10/2021 showed iron deficiency, no anemia.  On oral iron per PCP  -per PCP    Plan: -Recent mammogram and today's labs reviewed -Complete tamoxifen 10/2022 -Continue high risk screening program, breast MRI 06/2022 and mammogram 12/2022 -Follow-up in 1 year, or sooner if needed    Orders Placed This Encounter  Procedures   MM 3D SCREENING  MAMMOGRAM BILATERAL BREAST    Standing Status:   Future    Standing Expiration Date:   04/18/2023    Order Specific Question:   Reason for Exam (SYMPTOM  OR DIAGNOSIS REQUIRED)    Answer:   h/o LCIS, dense breast tissue    Order Specific Question:   Is the patient pregnant?    Answer:   No    Order Specific Question:   Preferred imaging location?    Answer:   [facility]      All questions were answered. The patient knows to call the clinic with any problems, questions or concerns. No barriers to learning were detected.   Cira Rue, NP-C 04/18/2022

## 2022-04-18 ENCOUNTER — Inpatient Hospital Stay: Payer: BC Managed Care – PPO | Admitting: Nurse Practitioner

## 2022-04-18 ENCOUNTER — Inpatient Hospital Stay: Payer: BC Managed Care – PPO | Attending: Nurse Practitioner

## 2022-04-18 ENCOUNTER — Encounter: Payer: Self-pay | Admitting: Nurse Practitioner

## 2022-04-18 VITALS — BP 107/72 | HR 98 | Temp 98.2°F | Resp 14 | Wt 137.2 lb

## 2022-04-18 DIAGNOSIS — Z79810 Long term (current) use of selective estrogen receptor modulators (SERMs): Secondary | ICD-10-CM | POA: Diagnosis not present

## 2022-04-18 DIAGNOSIS — Z1231 Encounter for screening mammogram for malignant neoplasm of breast: Secondary | ICD-10-CM | POA: Diagnosis not present

## 2022-04-18 DIAGNOSIS — D0502 Lobular carcinoma in situ of left breast: Secondary | ICD-10-CM

## 2022-04-18 DIAGNOSIS — Z170 Estrogen receptor positive status [ER+]: Secondary | ICD-10-CM | POA: Insufficient documentation

## 2022-04-18 LAB — CMP (CANCER CENTER ONLY)
ALT: 12 U/L (ref 0–44)
AST: 16 U/L (ref 15–41)
Albumin: 3.8 g/dL (ref 3.5–5.0)
Alkaline Phosphatase: 44 U/L (ref 38–126)
Anion gap: 4 — ABNORMAL LOW (ref 5–15)
BUN: 13 mg/dL (ref 6–20)
CO2: 29 mmol/L (ref 22–32)
Calcium: 9 mg/dL (ref 8.9–10.3)
Chloride: 107 mmol/L (ref 98–111)
Creatinine: 0.73 mg/dL (ref 0.44–1.00)
GFR, Estimated: >60 mL/min (ref >60)
Glucose, Bld: 95 mg/dL (ref 70–99)
Potassium: 3.8 mmol/L (ref 3.5–5.1)
Sodium: 140 mmol/L (ref 135–145)
Total Bilirubin: 0.4 mg/dL (ref 0.3–1.2)
Total Protein: 6.3 g/dL — ABNORMAL LOW (ref 6.5–8.1)

## 2022-04-18 LAB — CBC WITH DIFFERENTIAL (CANCER CENTER ONLY)
Abs Immature Granulocytes: 0.01 10*3/uL (ref 0.00–0.07)
Basophils Absolute: 0 10*3/uL (ref 0.0–0.1)
Basophils Relative: 1 %
Eosinophils Absolute: 0 10*3/uL (ref 0.0–0.5)
Eosinophils Relative: 1 %
HCT: 37.5 % (ref 36.0–46.0)
Hemoglobin: 12.6 g/dL (ref 12.0–15.0)
Immature Granulocytes: 0 %
Lymphocytes Relative: 39 %
Lymphs Abs: 1.5 10*3/uL (ref 0.7–4.0)
MCH: 31 pg (ref 26.0–34.0)
MCHC: 33.6 g/dL (ref 30.0–36.0)
MCV: 92.4 fL (ref 80.0–100.0)
Monocytes Absolute: 0.2 10*3/uL (ref 0.1–1.0)
Monocytes Relative: 6 %
Neutro Abs: 2 10*3/uL (ref 1.7–7.7)
Neutrophils Relative %: 53 %
Platelet Count: 198 10*3/uL (ref 150–400)
RBC: 4.06 MIL/uL (ref 3.87–5.11)
RDW: 12.9 % (ref 11.5–15.5)
WBC Count: 3.8 10*3/uL — ABNORMAL LOW (ref 4.0–10.5)
nRBC: 0 % (ref 0.0–0.2)

## 2022-04-22 ENCOUNTER — Other Ambulatory Visit: Payer: Self-pay | Admitting: Medical-Surgical

## 2022-04-25 ENCOUNTER — Other Ambulatory Visit: Payer: Self-pay | Admitting: Medical-Surgical

## 2022-04-25 ENCOUNTER — Telehealth: Payer: Self-pay | Admitting: Medical-Surgical

## 2022-04-25 ENCOUNTER — Other Ambulatory Visit: Payer: Self-pay

## 2022-04-25 MED ORDER — LEVOTHYROXINE SODIUM 88 MCG PO TABS: 88.0000 ug | ORAL_TABLET | Freq: Every day | ORAL | 0 refills | Status: DC

## 2022-04-25 NOTE — Telephone Encounter (Signed)
Pt called requesting a refill of levothyroxine (SYNTHROID) 88 MCG tablet [id number] pt only has one pill left. Pt has a appointment on 05/03/2022.

## 2022-04-25 NOTE — Telephone Encounter (Signed)
Sent 30 day supply to the pharmacy.

## 2022-05-03 ENCOUNTER — Encounter: Payer: Self-pay | Admitting: Medical-Surgical

## 2022-05-03 ENCOUNTER — Ambulatory Visit: Payer: BC Managed Care – PPO | Admitting: Medical-Surgical

## 2022-05-03 VITALS — BP 104/64 | HR 90 | Resp 20 | Ht 65.35 in | Wt 140.1 lb

## 2022-05-03 DIAGNOSIS — E890 Postprocedural hypothyroidism: Secondary | ICD-10-CM

## 2022-05-03 DIAGNOSIS — E611 Iron deficiency: Secondary | ICD-10-CM | POA: Diagnosis not present

## 2022-05-03 DIAGNOSIS — J302 Other seasonal allergic rhinitis: Secondary | ICD-10-CM

## 2022-05-03 DIAGNOSIS — J3089 Other allergic rhinitis: Secondary | ICD-10-CM

## 2022-05-03 MED ORDER — EPINEPHRINE 0.3 MG/0.3ML IJ SOAJ: 0.3000 mg | INTRAMUSCULAR | 5 refills | Status: AC | PRN

## 2022-05-03 NOTE — Progress Notes (Signed)
        Established patient visit  History, exam, impression, and plan:  1. Iron deficiency Previously diagnosed with iron deficiency.  Her last check was in September showing low percent saturation but otherwise normal values.  She was instructed to start a daily iron supplement.  She initially had difficulty with tolerating this however she was finally able to find a good regimen.  She has been taking it daily since September and is now tolerating it well.  Would like to have her levels rechecked. - Iron, TIBC and Ferritin Panel  2. Postablative hypothyroidism Has been treated with Synthroid 88 mcg daily for a number of years.  Only uses the brand name and does well with it.  She is due to have her thyroid level rechecked.  She ran out of her medication while waiting for an appointment and a refill was called in however this was sent as a generic.  She has been on the generic for 1 to 2 weeks.  No concerning symptoms at this point but would like to go back on brand-name.  Rechecking TSH today.  If looking good, we will send her brand-name Synthroid to the Synthroid delivers program.  She is taking information to sign up tonight. - TSH  3. Seasonal and perennial allergic rhinitis Some seasonal allergy symptoms flaring at this point with the recent high pollen.  Continue Xyzal 5 mg nightly.  Procedures performed this visit: None.  Return in about 6 months (around 11/02/2022) for annual physical exam.  __________________________________ Clearnce Sorrel, DNP, APRN, FNP-BC [facility] [place]

## 2022-05-03 NOTE — Patient Instructions (Signed)
Synthroid delivers program

## 2022-05-04 ENCOUNTER — Other Ambulatory Visit: Payer: Self-pay | Admitting: Medical-Surgical

## 2022-05-04 LAB — IRON,TIBC AND FERRITIN PANEL
%SAT: 38 % (calc) (ref 16–45)
Ferritin: 37 ng/mL (ref 16–232)
Iron: 124 ug/dL (ref 45–160)
TIBC: 330 mcg/dL (calc) (ref 250–450)

## 2022-05-04 LAB — TSH: TSH: 1.2 mIU/L

## 2022-05-04 MED ORDER — LEVOTHYROXINE SODIUM 88 MCG PO TABS: 88.0000 ug | ORAL_TABLET | Freq: Every morning | ORAL | 3 refills | Status: DC

## 2022-05-22 ENCOUNTER — Other Ambulatory Visit: Payer: Self-pay | Admitting: Medical-Surgical

## 2022-06-03 ENCOUNTER — Other Ambulatory Visit: Payer: Self-pay | Admitting: Hematology

## 2022-06-03 ENCOUNTER — Other Ambulatory Visit: Payer: Self-pay | Admitting: Medical-Surgical

## 2022-06-09 ENCOUNTER — Encounter: Payer: Self-pay | Admitting: Medical-Surgical

## 2022-06-10 MED ORDER — FLUTICASONE PROPIONATE 50 MCG/ACT NA SUSP: 2.0000 | Freq: Every day | NASAL | 5 refills | Status: DC

## 2022-06-20 ENCOUNTER — Ambulatory Visit
Admission: RE | Admit: 2022-06-20 | Discharge: 2022-06-20 | Disposition: A | Discharge status: Home / Self Care | Payer: BC Managed Care – PPO | Source: Ambulatory Visit | Attending: Hematology | Admitting: Hematology

## 2022-06-20 DIAGNOSIS — D0502 Lobular carcinoma in situ of left breast: Secondary | ICD-10-CM

## 2022-06-20 DIAGNOSIS — Z86000 Personal history of in-situ neoplasm of breast: Secondary | ICD-10-CM | POA: Diagnosis not present

## 2022-06-20 MED ORDER — GADOPICLENOL 0.5 MMOL/ML IV SOLN
7.0000 mL | Freq: Once | INTRAVENOUS | Status: AC | PRN
  Administered 2022-06-20: 7 mL via INTRAVENOUS

## 2022-09-08 ENCOUNTER — Telehealth: Payer: BC Managed Care – PPO | Admitting: Medical-Surgical

## 2022-10-05 DIAGNOSIS — Z01419 Encounter for gynecological examination (general) (routine) without abnormal findings: Secondary | ICD-10-CM | POA: Diagnosis not present

## 2022-10-05 DIAGNOSIS — R319 Hematuria, unspecified: Secondary | ICD-10-CM | POA: Diagnosis not present

## 2022-10-05 DIAGNOSIS — Z6822 Body mass index (BMI) 22.0-22.9, adult: Secondary | ICD-10-CM | POA: Diagnosis not present

## 2022-10-05 DIAGNOSIS — Z124 Encounter for screening for malignant neoplasm of cervix: Secondary | ICD-10-CM | POA: Diagnosis not present

## 2022-10-28 ENCOUNTER — Encounter: Payer: BC Managed Care – PPO | Admitting: Medical-Surgical

## 2022-11-02 DIAGNOSIS — Z853 Personal history of malignant neoplasm of breast: Secondary | ICD-10-CM | POA: Diagnosis not present

## 2022-11-04 ENCOUNTER — Ambulatory Visit (INDEPENDENT_AMBULATORY_CARE_PROVIDER_SITE_OTHER): Payer: BC Managed Care – PPO | Admitting: Medical-Surgical

## 2022-11-04 ENCOUNTER — Encounter: Payer: Self-pay | Admitting: Medical-Surgical

## 2022-11-04 VITALS — BP 96/62 | HR 78 | Resp 20 | Ht 65.35 in | Wt 140.9 lb

## 2022-11-04 DIAGNOSIS — J3089 Other allergic rhinitis: Secondary | ICD-10-CM

## 2022-11-04 DIAGNOSIS — E611 Iron deficiency: Secondary | ICD-10-CM

## 2022-11-04 DIAGNOSIS — E890 Postprocedural hypothyroidism: Secondary | ICD-10-CM

## 2022-11-04 DIAGNOSIS — Z0000 Encounter for general adult medical examination without abnormal findings: Secondary | ICD-10-CM | POA: Diagnosis not present

## 2022-11-04 DIAGNOSIS — Z13220 Encounter for screening for lipoid disorders: Secondary | ICD-10-CM

## 2022-11-04 DIAGNOSIS — J302 Other seasonal allergic rhinitis: Secondary | ICD-10-CM

## 2022-11-04 MED ORDER — FLUTICASONE PROPIONATE 50 MCG/ACT NA SUSP: 2.0000 | Freq: Every day | NASAL | 5 refills | Status: DC

## 2022-11-04 MED ORDER — LEVOCETIRIZINE DIHYDROCHLORIDE 5 MG PO TABS: 5.0000 mg | ORAL_TABLET | Freq: Every evening | ORAL | 2 refills | Status: DC

## 2022-11-04 NOTE — Progress Notes (Signed)
Complete physical exam  Patient: Sheri [name]   DOB: 05-Aug-1970   52 y.o. Female  MRN: [id number]  Subjective:    Chief Complaint  Patient presents with   Annual Exam    Sheri [name] is a 52 y.o. female who presents today for a complete physical exam. She reports consuming a general diet.  Walking or doing light weights for exercise.  She generally feels well. She reports sleeping well. She does not have additional problems to discuss today.    Most recent fall risk assessment:    05/03/2022    2:01 PM  Fall Risk   Falls in the past year? 0  Number falls in past yr: 0  Injury with Fall? 0  Risk for fall due to : No Fall Risks  Follow up Falls evaluation completed     Most recent depression screenings:    05/03/2022    2:01 PM 10/27/2021    9:46 AM  PHQ 2/9 Scores  PHQ - 2 Score 0 0    Vision:Within last year and Dental: No current dental problems and Receives regular dental care    Patient Care Team: Christen Butter, NP as PCP - General (Nurse Practitioner) Abigail Miyamoto, MD as Consulting Physician (General Surgery) Malachy Mood, MD as Consulting Physician (Hematology)   Outpatient Medications Prior to Visit  Medication Sig   calcium carbonate (CALCIUM 600) 600 MG TABS tablet Take 600 mg by mouth 2 (two) times daily with a meal.   Cholecalciferol (VITAMIN D) 50 MCG (2000 UT) CAPS Take by mouth.   EPINEPHrine (EPIPEN 2-PAK) 0.3 mg/0.3 mL IJ SOAJ injection Inject 0.3 mg into the muscle as needed for anaphylaxis.   ferrous sulfate 325 (65 FE) MG tablet Take 325 mg by mouth at bedtime.   levothyroxine (SYNTHROID) 88 MCG tablet Take 1 tablet (88 mcg total) by mouth every morning.   Polyethyl Glycol-Propyl Glycol (SYSTANE) 0.4-0.3 % SOLN Apply to eye.   RESTASIS 0.05 % ophthalmic emulsion 1 drop 2 (two) times daily.   [DISCONTINUED] fluticasone (FLONASE) 50 MCG/ACT nasal spray Place 2 sprays into both nostrils daily.   [DISCONTINUED] levocetirizine (XYZAL) 5 MG  tablet TAKE 1 TABLET BY MOUTH EVERY DAY IN THE EVENING   [DISCONTINUED] tamoxifen (NOLVADEX) 20 MG tablet TAKE 1 TABLET BY MOUTH EVERY DAY   No facility-administered medications prior to visit.    Review of Systems  Constitutional:  Negative for chills, fever, malaise/fatigue and weight loss.  HENT:  Negative for congestion, ear pain, hearing loss, sinus pain and sore throat.   Eyes:  Negative for blurred vision, photophobia and pain.  Respiratory:  Negative for cough, shortness of breath and wheezing.   Cardiovascular:  Negative for chest pain, palpitations and leg swelling.  Gastrointestinal:  Negative for abdominal pain, constipation, diarrhea, heartburn, nausea and vomiting.  Genitourinary:  Negative for dysuria, frequency and urgency.  Musculoskeletal:  Negative for falls and neck pain.  Skin:  Negative for itching and rash.  Neurological:  Negative for dizziness, weakness and headaches.  Endo/Heme/Allergies:  Negative for polydipsia. Does not bruise/bleed easily.  Psychiatric/Behavioral:  Negative for depression, substance abuse and suicidal ideas. The patient is not nervous/anxious.      Objective:    BP 96/62 (BP Location: Right Arm, Cuff Size: Normal)   Pulse 78   Resp 20   Ht 5' 5.35" (1.66 m)   Wt 140 lb 14.4 oz (63.9 kg)   SpO2 99%   BMI 23.20 kg/m  Physical Exam Constitutional:      General: She is not in acute distress.    Appearance: Normal appearance. She is not ill-appearing.  HENT:     Head: Normocephalic and atraumatic.     Right Ear: Tympanic membrane, ear canal and external ear normal. There is no impacted cerumen.     Left Ear: Tympanic membrane, ear canal and external ear normal. There is no impacted cerumen.     Nose: Nose normal. No congestion or rhinorrhea.     Mouth/Throat:     Mouth: Mucous membranes are moist.     Pharynx: No oropharyngeal exudate or posterior oropharyngeal erythema.  Eyes:     General: No scleral icterus.       Right eye:  No discharge.        Left eye: No discharge.     Extraocular Movements: Extraocular movements intact.     Conjunctiva/sclera: Conjunctivae normal.     Pupils: Pupils are equal, round, and reactive to light.  Neck:     Thyroid: No thyromegaly.     Vascular: No carotid bruit or JVD.     Trachea: Trachea normal.  Cardiovascular:     Rate and Rhythm: Normal rate and regular rhythm.     Pulses: Normal pulses.     Heart sounds: Normal heart sounds. No murmur heard.    No friction rub. No gallop.  Pulmonary:     Effort: Pulmonary effort is normal. No respiratory distress.     Breath sounds: Normal breath sounds. No wheezing.  Abdominal:     General: Bowel sounds are normal. There is no distension.     Palpations: Abdomen is soft.     Tenderness: There is no abdominal tenderness. There is no guarding.  Musculoskeletal:        General: Normal range of motion.     Cervical back: Normal range of motion and neck supple.  Lymphadenopathy:     Cervical: No cervical adenopathy.  Skin:    General: Skin is warm and dry.  Neurological:     Mental Status: She is alert and oriented to person, place, and time.     Cranial Nerves: No cranial nerve deficit.  Psychiatric:        Mood and Affect: Mood normal.        Behavior: Behavior normal.        Thought Content: Thought content normal.        Judgment: Judgment normal.      No results found for any visits on 11/04/22.     Assessment & Plan:    Routine Health Maintenance and Physical Exam  Immunization History  Administered Date(s) Administered   Influenza Split 03/20/2012   Influenza,inj,Quad PF,6+ Mos 10/31/2012, 11/05/2016   Influenza-Unspecified 11/09/2017, 11/08/2018, 11/11/2020, 10/20/2021, 10/29/2022   PFIZER(Purple Top)SARS-COV-2 Vaccination 10/22/2020   Td 07/02/2002   Tdap 05/26/2016   Unspecified SARS-COV-2 Vaccination 09/24/2022   Varicella 10/25/2022   Zoster Recombinant(Shingrix) 04/23/2021, 08/05/2021    Health  Maintenance  Topic Date Due   COVID-19 Vaccine (3 - Mixed Product risk series) 11/20/2022 (Originally 10/22/2022)   Cervical Cancer Screening (HPV/Pap Cotest)  09/08/2023   MAMMOGRAM  06/19/2024   DTaP/Tdap/Td (3 - Td or Tdap) 05/27/2026   Colonoscopy  05/21/2029   INFLUENZA VACCINE  Completed   Zoster Vaccines- Shingrix  Completed   HPV VACCINES  Aged Out   Hepatitis C Screening  Discontinued   HIV Screening  Discontinued    Discussed health benefits of physical  activity, and encouraged her to engage in regular exercise appropriate for her age and condition.  1. Annual physical exam Checking labs as below.  Up-to-date on preventative care.  Wellness information provided with AVS. - CBC with Differential/Platelet - CMP14+EGFR  2. Seasonal and perennial allergic rhinitis Continue Xyzal and Flonase.  3. Postablative hypothyroidism Checking TSH.  Continue Synthroid, titrate depending on results. - TSH  4. Iron deficiency Checking iron today. - Iron, TIBC and Ferritin Panel  5. Lipid screening Checking lipids today. - Lipid panel  Return in about 1 year (around 11/04/2023) for annual physical exam or sooner if needed.   Christen Butter, NP

## 2022-11-04 NOTE — Patient Instructions (Signed)
Preventive Care 40-51 Years Old, Female Preventive care refers to lifestyle choices and visits with your health care provider that can promote health and wellness. Preventive care visits are also called wellness exams. What can I expect for my preventive care visit? Counseling Your health care provider may ask you questions about your: Medical history, including: Past medical problems. Family medical history. Pregnancy history. Current health, including: Menstrual cycle. Method of birth control. Emotional well-being. Home life and relationship well-being. Sexual activity and sexual health. Lifestyle, including: Alcohol, nicotine or tobacco, and drug use. Access to firearms. Diet, exercise, and sleep habits. Work and work environment. Sunscreen use. Safety issues such as seatbelt and bike helmet use. Physical exam Your health care provider will check your: Height and weight. These may be used to calculate your BMI (body mass index). BMI is a measurement that tells if you are at a healthy weight. Waist circumference. This measures the distance around your waistline. This measurement also tells if you are at a healthy weight and may help predict your risk of certain diseases, such as type 2 diabetes and high blood pressure. Heart rate and blood pressure. Body temperature. Skin for abnormal spots. What immunizations do I need?  Vaccines are usually given at various ages, according to a schedule. Your health care provider will recommend vaccines for you based on your age, medical history, and lifestyle or other factors, such as travel or where you work. What tests do I need? Screening Your health care provider may recommend screening tests for certain conditions. This may include: Lipid and cholesterol levels. Diabetes screening. This is done by checking your blood sugar (glucose) after you have not eaten for a while (fasting). Pelvic exam and Pap test. Hepatitis B test. Hepatitis C  test. HIV (human immunodeficiency virus) test. STI (sexually transmitted infection) testing, if you are at risk. Lung cancer screening. Colorectal cancer screening. Mammogram. Talk with your health care provider about when you should start having regular mammograms. This may depend on whether you have a family history of breast cancer. BRCA-related cancer screening. This may be done if you have a family history of breast, ovarian, tubal, or peritoneal cancers. Bone density scan. This is done to screen for osteoporosis. Talk with your health care provider about your test results, treatment options, and if necessary, the need for more tests. Follow these instructions at home: Eating and drinking  Eat a diet that includes fresh fruits and vegetables, whole grains, lean protein, and low-fat dairy products. Take vitamin and mineral supplements as recommended by your health care provider. Do not drink alcohol if: Your health care provider tells you not to drink. You are pregnant, may be pregnant, or are planning to become pregnant. If you drink alcohol: Limit how much you have to 0-1 drink a day. Know how much alcohol is in your drink. In the [place] one drink equals one 12 oz bottle of beer (355 mL), one 5 oz glass of wine (148 mL), or one 1 oz glass of hard liquor (44 mL). Lifestyle Brush your teeth every morning and night with fluoride toothpaste. Floss one time each day. Exercise for at least 30 minutes 5 or more days each week. Do not use any products that contain nicotine or tobacco. These products include cigarettes, chewing tobacco, and vaping devices, such as e-cigarettes. If you need help quitting, ask your health care provider. Do not use drugs. If you are sexually active, practice safe sex. Use a condom or other form of protection to   prevent STIs. If you do not wish to become pregnant, use a form of birth control. If you plan to become pregnant, see your health care provider for a  prepregnancy visit. Take aspirin only as told by your health care provider. Make sure that you understand how much to take and what form to take. Work with your health care provider to find out whether it is safe and beneficial for you to take aspirin daily. Find healthy ways to manage stress, such as: Meditation, yoga, or listening to music. Journaling. Talking to a trusted person. Spending time with friends and family. Minimize exposure to UV radiation to reduce your risk of skin cancer. Safety Always wear your seat belt while driving or riding in a vehicle. Do not drive: If you have been drinking alcohol. Do not ride with someone who has been drinking. When you are tired or distracted. While texting. If you have been using any mind-altering substances or drugs. Wear a helmet and other protective equipment during sports activities. If you have firearms in your house, make sure you follow all gun safety procedures. Seek help if you have been physically or sexually abused. What's next? Visit your health care provider once a year for an annual wellness visit. Ask your health care provider how often you should have your eyes and teeth checked. Stay up to date on all vaccines. This information is not intended to replace advice given to you by your health care provider. Make sure you discuss any questions you have with your health care provider. Document Revised: 07/15/2020 Document Reviewed: 07/15/2020 Elsevier Patient Education  2024 Elsevier Inc.  

## 2022-11-07 DIAGNOSIS — Z0000 Encounter for general adult medical examination without abnormal findings: Secondary | ICD-10-CM | POA: Diagnosis not present

## 2022-11-07 DIAGNOSIS — E611 Iron deficiency: Secondary | ICD-10-CM | POA: Diagnosis not present

## 2022-11-07 DIAGNOSIS — Z13220 Encounter for screening for lipoid disorders: Secondary | ICD-10-CM | POA: Diagnosis not present

## 2022-11-07 DIAGNOSIS — E890 Postprocedural hypothyroidism: Secondary | ICD-10-CM | POA: Diagnosis not present

## 2022-11-08 ENCOUNTER — Other Ambulatory Visit: Payer: Self-pay | Admitting: Hematology

## 2022-11-08 ENCOUNTER — Encounter: Payer: Self-pay | Admitting: Medical-Surgical

## 2022-11-08 ENCOUNTER — Other Ambulatory Visit: Payer: Self-pay | Admitting: Medical-Surgical

## 2022-11-08 DIAGNOSIS — Z1231 Encounter for screening mammogram for malignant neoplasm of breast: Secondary | ICD-10-CM

## 2022-11-08 LAB — CBC WITH DIFFERENTIAL/PLATELET
Basophils Absolute: 0 10*3/uL (ref 0.0–0.2)
Basos: 1 %
EOS (ABSOLUTE): 0 10*3/uL (ref 0.0–0.4)
Eos: 1 %
Hematocrit: 44.3 % (ref 34.0–46.6)
Hemoglobin: 14.1 g/dL (ref 11.1–15.9)
Immature Grans (Abs): 0 10*3/uL (ref 0.0–0.1)
Immature Granulocytes: 0 %
Lymphocytes Absolute: 1.4 10*3/uL (ref 0.7–3.1)
Lymphs: 45 %
MCH: 29.6 pg (ref 26.6–33.0)
MCHC: 31.8 g/dL (ref 31.5–35.7)
MCV: 93 fL (ref 79–97)
Monocytes Absolute: 0.2 10*3/uL (ref 0.1–0.9)
Monocytes: 6 %
Neutrophils Absolute: 1.5 10*3/uL (ref 1.4–7.0)
Neutrophils: 47 %
Platelets: 245 10*3/uL (ref 150–450)
RBC: 4.77 x10E6/uL (ref 3.77–5.28)
RDW: 11.9 % (ref 11.7–15.4)
WBC: 3.1 10*3/uL — ABNORMAL LOW (ref 3.4–10.8)

## 2022-11-08 LAB — CMP14+EGFR
ALT: 19 [IU]/L (ref 0–32)
AST: 25 [IU]/L (ref 0–40)
Albumin: 4.3 g/dL (ref 3.8–4.9)
Alkaline Phosphatase: 68 [IU]/L (ref 44–121)
BUN/Creatinine Ratio: 11 (ref 9–23)
BUN: 8 mg/dL (ref 6–24)
Bilirubin Total: 0.3 mg/dL (ref 0.0–1.2)
CO2: 21 mmol/L (ref 20–29)
Calcium: 9.3 mg/dL (ref 8.7–10.2)
Chloride: 104 mmol/L (ref 96–106)
Creatinine, Ser: 0.7 mg/dL (ref 0.57–1.00)
Globulin, Total: 2.1 g/dL (ref 1.5–4.5)
Glucose: 80 mg/dL (ref 70–99)
Potassium: 4.2 mmol/L (ref 3.5–5.2)
Sodium: 139 mmol/L (ref 134–144)
Total Protein: 6.4 g/dL (ref 6.0–8.5)
eGFR: 105 mL/min/{1.73_m2} (ref >59)

## 2022-11-08 LAB — TSH: TSH: 0.314 u[IU]/mL — ABNORMAL LOW (ref 0.450–4.500)

## 2022-11-08 LAB — IRON,TIBC AND FERRITIN PANEL
Ferritin: 53 ng/mL (ref 15–150)
Iron Saturation: 15 % (ref 15–55)
Iron: 44 ug/dL (ref 27–159)
Total Iron Binding Capacity: 296 ug/dL (ref 250–450)
UIBC: 252 ug/dL (ref 131–425)

## 2022-11-08 LAB — LIPID PANEL
Chol/HDL Ratio: 1.9 {ratio} (ref 0.0–4.4)
Cholesterol, Total: 203 mg/dL — ABNORMAL HIGH (ref 100–199)
HDL: 106 mg/dL (ref >39)
LDL Chol Calc (NIH): 87 mg/dL (ref 0–99)
Triglycerides: 54 mg/dL (ref 0–149)
VLDL Cholesterol Cal: 10 mg/dL (ref 5–40)

## 2022-11-08 MED ORDER — LEVOTHYROXINE SODIUM 75 MCG PO TABS: 75.0000 ug | ORAL_TABLET | Freq: Every morning | ORAL | 0 refills | Status: DC

## 2023-01-02 ENCOUNTER — Ambulatory Visit
Admission: RE | Admit: 2023-01-02 | Discharge: 2023-01-02 | Disposition: A | Discharge status: Home / Self Care | Payer: BC Managed Care – PPO | Source: Ambulatory Visit | Attending: Hematology | Admitting: Hematology

## 2023-01-02 DIAGNOSIS — Z1231 Encounter for screening mammogram for malignant neoplasm of breast: Secondary | ICD-10-CM

## 2023-01-11 ENCOUNTER — Encounter: Payer: Self-pay | Admitting: Medical-Surgical

## 2023-01-11 DIAGNOSIS — E039 Hypothyroidism, unspecified: Secondary | ICD-10-CM

## 2023-01-23 DIAGNOSIS — E039 Hypothyroidism, unspecified: Secondary | ICD-10-CM | POA: Diagnosis not present

## 2023-01-24 LAB — TSH: TSH: 2.77 u[IU]/mL (ref 0.450–4.500)

## 2023-01-26 ENCOUNTER — Encounter: Payer: Self-pay | Admitting: Medical-Surgical

## 2023-01-26 ENCOUNTER — Other Ambulatory Visit: Payer: Self-pay

## 2023-01-26 MED ORDER — SYNTHROID 75 MCG PO TABS: 75.0000 ug | ORAL_TABLET | Freq: Every morning | ORAL | 1 refills | Status: DC

## 2023-01-26 NOTE — Telephone Encounter (Signed)
Will script have to be changed to Synthroid for brand name as patient requested?

## 2023-04-11 ENCOUNTER — Encounter: Payer: Self-pay | Admitting: Medical-Surgical

## 2023-04-17 ENCOUNTER — Other Ambulatory Visit: Payer: Self-pay

## 2023-04-17 DIAGNOSIS — D0502 Lobular carcinoma in situ of left breast: Secondary | ICD-10-CM

## 2023-04-18 ENCOUNTER — Encounter: Payer: Self-pay | Admitting: Nurse Practitioner

## 2023-04-18 ENCOUNTER — Inpatient Hospital Stay: Payer: BC Managed Care – PPO | Attending: Hematology

## 2023-04-18 ENCOUNTER — Inpatient Hospital Stay (HOSPITAL_BASED_OUTPATIENT_CLINIC_OR_DEPARTMENT_OTHER): Payer: BC Managed Care – PPO | Admitting: Nurse Practitioner

## 2023-04-18 VITALS — BP 102/64 | HR 97 | Temp 98.0°F | Resp 18 | Ht 65.35 in | Wt 128.0 lb

## 2023-04-18 DIAGNOSIS — D0502 Lobular carcinoma in situ of left breast: Secondary | ICD-10-CM

## 2023-04-18 DIAGNOSIS — Z86000 Personal history of in-situ neoplasm of breast: Secondary | ICD-10-CM | POA: Diagnosis not present

## 2023-04-18 DIAGNOSIS — R9230 Dense breasts, unspecified: Secondary | ICD-10-CM | POA: Insufficient documentation

## 2023-04-18 DIAGNOSIS — E039 Hypothyroidism, unspecified: Secondary | ICD-10-CM | POA: Diagnosis not present

## 2023-04-18 LAB — CMP (CANCER CENTER ONLY)
ALT: 10 U/L (ref 0–44)
AST: 16 U/L (ref 15–41)
Albumin: 4.3 g/dL (ref 3.5–5.0)
Alkaline Phosphatase: 56 U/L (ref 38–126)
Anion gap: 3 — ABNORMAL LOW (ref 5–15)
BUN: 14 mg/dL (ref 6–20)
CO2: 30 mmol/L (ref 22–32)
Calcium: 9.6 mg/dL (ref 8.9–10.3)
Chloride: 106 mmol/L (ref 98–111)
Creatinine: 0.76 mg/dL (ref 0.44–1.00)
GFR, Estimated: >60 mL/min (ref >60)
Glucose, Bld: 72 mg/dL (ref 70–99)
Potassium: 4 mmol/L (ref 3.5–5.1)
Sodium: 139 mmol/L (ref 135–145)
Total Bilirubin: 0.6 mg/dL (ref 0.0–1.2)
Total Protein: 6.7 g/dL (ref 6.5–8.1)

## 2023-04-18 LAB — CBC WITH DIFFERENTIAL (CANCER CENTER ONLY)
Abs Immature Granulocytes: 0.01 10*3/uL (ref 0.00–0.07)
Basophils Absolute: 0 10*3/uL (ref 0.0–0.1)
Basophils Relative: 1 %
Eosinophils Absolute: 0 10*3/uL (ref 0.0–0.5)
Eosinophils Relative: 1 %
HCT: 41.7 % (ref 36.0–46.0)
Hemoglobin: 14.3 g/dL (ref 12.0–15.0)
Immature Granulocytes: 0 %
Lymphocytes Relative: 33 %
Lymphs Abs: 1.5 10*3/uL (ref 0.7–4.0)
MCH: 30.8 pg (ref 26.0–34.0)
MCHC: 34.3 g/dL (ref 30.0–36.0)
MCV: 89.7 fL (ref 80.0–100.0)
Monocytes Absolute: 0.3 10*3/uL (ref 0.1–1.0)
Monocytes Relative: 7 %
Neutro Abs: 2.6 10*3/uL (ref 1.7–7.7)
Neutrophils Relative %: 58 %
Platelet Count: 188 10*3/uL (ref 150–400)
RBC: 4.65 MIL/uL (ref 3.87–5.11)
RDW: 12.1 % (ref 11.5–15.5)
WBC Count: 4.4 10*3/uL (ref 4.0–10.5)
nRBC: 0 % (ref 0.0–0.2)

## 2023-04-18 NOTE — Progress Notes (Signed)
 Patient Care Team: Christen Butter, NP as PCP - General (Nurse Practitioner) Abigail Miyamoto, MD as Consulting Physician (General Surgery) Malachy Mood, MD as Consulting Physician (Hematology)   CHIEF COMPLAINT: Follow up left breast LCIS   Oncology History  Lobular carcinoma in situ (LCIS) of left breast  08/15/2017 Initial Biopsy   Diagnosis Breast, left, needle core biopsy, upper outer quadrant - LOBULAR NEOPLASIA (ATYPICAL LOBULAR HYPERPLASIA). - FIBROCYSTIC CHANGE AND ADENOSIS WITH CALCIFICATIONS. - PSEUDOANGIOMATOUS STROMAL HYPERPLASIA (PASH).   09/15/2017 Pathology Results   Diagnosis Breast, lumpectomy, left - LOBULAR NEOPLASIA (LOBULAR CARCINOMA IN SITU). - HEALING BIOPSY SITE. - SEE COMMENT.   10/07/2017 Initial Diagnosis   Lobular carcinoma in situ (LCIS) of left breast   10/10/2018 -  Anti-estrogen oral therapy   Tamoxifen started 10/09/2017      CURRENT THERAPY: Tamoxifen 20 mg daily, starting 10/19/2017 - 10/2022  INTERVAL HISTORY Sheri [name] returns for follow-up, last seen by me 04/18/2022.  She stopped tamoxifen and has been feeling well in general.  She has found it easier to lose weight intentionally.  She is perimenopausal now, cramping but not bleeding, and more emotional.  Also has been monitoring thyroid function which may need to be adjusted.  She continues working which she enjoys.  Denies any specific breast concerns such as new lump/mass, nipple discharge or inversion, or skin change.  ROS  Other systems reviewed and negative  Past Medical History:  Diagnosis Date   Abnormal cervical Papanicolaou smear 10/27/2021   20 YEARS AGO   Allergy    Breast mass, left    DCIS (ductal carcinoma in situ)    left breast   Eye abnormality 05/27/2016   Seems a bit young for arcus senilis but possible limbus sign? Await calcium levels though apparently these can be normal. Ophthalmology notes reviewed the very seventh 2018: Corneal arcus, vision concerns include myopia,  astigmatism, presbyopia,   Graves' disease 10/27/2021   Hypothyroidism    Seasonal allergies    Sebaceous cyst    right buttock   Thyroid disease      Past Surgical History:  Procedure Laterality Date   BREAST BIOPSY     BREAST EXCISIONAL BIOPSY Left    BREAST LUMPECTOMY Left 09/15/2017   DCIS   BREAST LUMPECTOMY WITH RADIOACTIVE SEED LOCALIZATION Left 09/15/2017   Procedure: BREAST LUMPECTOMY WITH RADIOACTIVE SEED LOCALIZATION;  Surgeon: Abigail Miyamoto, MD;  Location: [facility];  Service: General;  Laterality: Left;   CYST EXCISION Right 09/15/2017   Procedure: EXCISION OF 1CM RIGHT BUTTOCK SEBACEOUS CYST;  Surgeon: Abigail Miyamoto, MD;  Location: [facility];  Service: General;  Laterality: Right;   WISDOM TOOTH EXTRACTION       Outpatient Encounter Medications as of 04/18/2023  Medication Sig   calcium carbonate (CALCIUM 600) 600 MG TABS tablet Take 600 mg by mouth 2 (two) times daily with a meal.   Cholecalciferol (VITAMIN D) 50 MCG (2000 UT) CAPS Take by mouth.   EPINEPHrine (EPIPEN 2-PAK) 0.3 mg/0.3 mL IJ SOAJ injection Inject 0.3 mg into the muscle as needed for anaphylaxis.   ferrous sulfate 325 (65 FE) MG tablet Take 325 mg by mouth at bedtime.   fluticasone (FLONASE) 50 MCG/ACT nasal spray Place 2 sprays into both nostrils daily.   levocetirizine (XYZAL) 5 MG tablet Take 1 tablet (5 mg total) by mouth every evening.   Polyethyl Glycol-Propyl Glycol (SYSTANE) 0.4-0.3 % SOLN Apply to eye.   RESTASIS 0.05 % ophthalmic emulsion  1 drop 2 (two) times daily.   SYNTHROID 75 MCG tablet Take 1 tablet (75 mcg total) by mouth every morning.   No facility-administered encounter medications on file as of 04/18/2023.     Today's Vitals   04/18/23 1022  BP: 102/64  Pulse: 97  Resp: 18  Temp: 98 F (36.7 C)  TempSrc: Temporal  SpO2: 99%  Weight: 128 lb (58.1 kg)  Height: 5' 5.35" (1.66 m)   Body mass index is 21.07 kg/m.   PHYSICAL  EXAM GENERAL:alert, no distress and comfortable SKIN: no rash  EYES: sclera clear NECK: without mass LYMPH:  no palpable cervical or supraclavicular lymphadenopathy  LUNGS:  normal breathing effort HEART:  no lower extremity edema ABDOMEN: abdomen soft, non-tender and normal bowel sounds NEURO: alert & oriented x 3 with fluent speech, no focal motor/sensory deficits Breast exam: No nipple discharge or inversion.  Remote left lumpectomy, incisions completely healed.  Focal clustered densities in the left upper outer quadrant. R breast and both axilla benign   CBC    Component Value Date/Time   WBC 4.4 04/18/2023 1003   WBC 3.6 (L) 10/27/2021 0000   RBC 4.65 04/18/2023 1003   HGB 14.3 04/18/2023 1003   HGB 14.1 11/07/2022 0937   HCT 41.7 04/18/2023 1003   HCT 44.3 11/07/2022 0937   PLT 188 04/18/2023 1003   PLT 245 11/07/2022 0937   MCV 89.7 04/18/2023 1003   MCV 93 11/07/2022 0937   MCH 30.8 04/18/2023 1003   MCHC 34.3 04/18/2023 1003   RDW 12.1 04/18/2023 1003   RDW 11.9 11/07/2022 0937   LYMPHSABS 1.5 04/18/2023 1003   LYMPHSABS 1.4 11/07/2022 0937   MONOABS 0.3 04/18/2023 1003   EOSABS 0.0 04/18/2023 1003   EOSABS 0.0 11/07/2022 0937   BASOSABS 0.0 04/18/2023 1003   BASOSABS 0.0 11/07/2022 0937     CMP     Component Value Date/Time   NA 139 04/18/2023 1003   NA 139 11/07/2022 0937   K 4.0 04/18/2023 1003   CL 106 04/18/2023 1003   CO2 30 04/18/2023 1003   GLUCOSE 72 04/18/2023 1003   BUN 14 04/18/2023 1003   BUN 8 11/07/2022 0937   CREATININE 0.76 04/18/2023 1003   CREATININE 0.82 10/27/2021 0000   CALCIUM 9.6 04/18/2023 1003   PROT 6.7 04/18/2023 1003   PROT 6.4 11/07/2022 0937   ALBUMIN 4.3 04/18/2023 1003   ALBUMIN 4.3 11/07/2022 0937   AST 16 04/18/2023 1003   ALT 10 04/18/2023 1003   ALKPHOS 56 04/18/2023 1003   BILITOT 0.6 04/18/2023 1003   GFRNONAA >60 04/18/2023 1003   GFRNONAA 103 04/17/2019 1000   GFRAA >60 04/17/2019 1405   GFRAA 119  04/17/2019 1000     ASSESSMENT & PLAN:Sheri [name] is a 53 y.o. female with    1. Lobular carcinoma in situ of left breast  -Diagnosed in 10/2017. S/p left lumpectomy -Completed chemoprevention with Tamoxifen tin 10/2017 - 10/2022 -On High risk screening program with annual mammograms and breast MRIs staggered 6 months apart; 2024 imaging were benign -Ms. Imhoff is clinically doing well. Breast exam reveals firm densities in the left upper outer quadrant, possibly scar tissue vs dense tissue which could be affected by menstrual cycle. However, given her history will want to r/o malignancy -Proceed with left diag mammo/US, pt agrees; will call with results -If negative, continue screening and annual f/up; MRI 07/2023 and bilat mammo 01/2024. Covered by insurance -We discussed contrast enhanced mammo which  may eliminate need for MRI in future, will continue current plan for now   2.  Age-appropriate health maintenance -Continue healthy active lifestyle and age-appropriate health maintenance -On oral iron per PCP    PLAN: -Labs and recent imaging reviewed -Proceed with L diag mammo/US; will call with results -If negative, continue screening with MRI in 07/2023, bilat mammo 01/2024 -Annual f/up    Orders Placed This Encounter  Procedures   MM DIAG BREAST TOMO UNI LEFT    Standing Status:   Future    Expected Date:   04/25/2023    Expiration Date:   04/17/2024    Reason for Exam (SYMPTOM  OR DIAGNOSIS REQUIRED):   focal densities L UOQ s/p left lumpectomy 2019 LCIS    Is the patient pregnant?:   No    [facility]   [place] LIMITED ULTRASOUND INCLUDING AXILLA LEFT BREAST     Standing Status:   Future    Expected Date:   04/25/2023    Expiration Date:   04/17/2024    Reason for Exam (SYMPTOM  OR DIAGNOSIS REQUIRED):   focal densities L UOQ, s/p lumpectomy in 2019 LCIS    Preferred Imaging Location?:   [facility]   MR BREAST BILATERAL W WO CONTRAST  INC CAD    Standing Status:   Future    Expected Date:   07/19/2023    Expiration Date:   04/17/2024    If indicated for the ordered procedure, I authorize the administration of contrast media per Radiology protocol:   Yes    What is the patient's sedation requirement?:   No Sedation    Does the patient have a pacemaker or implanted devices?:   No    Preferred imaging location?:   [facility] (table limit-550lbs)      All questions were answered. The patient knows to call the clinic with any problems, questions or concerns. No barriers to learning were detected. I spent 20 minutes counseling the patient face to face. The total time spent in the appointment was 30 minutes and more than 50% was on counseling, review of test results, and coordination of care.   Santiago Glad, NP-C 04/18/2023

## 2023-04-19 ENCOUNTER — Encounter: Payer: Self-pay | Admitting: Medical-Surgical

## 2023-04-19 LAB — TSH: TSH: 3.22 u[IU]/mL (ref 0.450–4.500)

## 2023-04-24 DIAGNOSIS — N925 Other specified irregular menstruation: Secondary | ICD-10-CM | POA: Diagnosis not present

## 2023-04-24 DIAGNOSIS — R102 Pelvic and perineal pain: Secondary | ICD-10-CM | POA: Diagnosis not present

## 2023-05-01 ENCOUNTER — Other Ambulatory Visit

## 2023-05-01 ENCOUNTER — Encounter

## 2023-05-24 ENCOUNTER — Ambulatory Visit
Admission: RE | Admit: 2023-05-24 | Discharge: 2023-05-24 | Disposition: A | Discharge status: Home / Self Care | Source: Ambulatory Visit | Attending: Nurse Practitioner

## 2023-05-24 ENCOUNTER — Ambulatory Visit
Admission: RE | Admit: 2023-05-24 | Discharge: 2023-05-24 | Disposition: A | Discharge status: Home / Self Care | Source: Ambulatory Visit | Attending: Nurse Practitioner | Admitting: Nurse Practitioner

## 2023-05-24 DIAGNOSIS — N6321 Unspecified lump in the left breast, upper outer quadrant: Secondary | ICD-10-CM | POA: Diagnosis not present

## 2023-05-24 DIAGNOSIS — D0502 Lobular carcinoma in situ of left breast: Secondary | ICD-10-CM

## 2023-05-24 DIAGNOSIS — Z86000 Personal history of in-situ neoplasm of breast: Secondary | ICD-10-CM | POA: Diagnosis not present

## 2023-05-25 ENCOUNTER — Encounter: Payer: Self-pay | Admitting: Nurse Practitioner

## 2023-06-06 ENCOUNTER — Other Ambulatory Visit: Payer: Self-pay | Admitting: Medical-Surgical

## 2023-07-21 ENCOUNTER — Other Ambulatory Visit: Payer: Self-pay | Admitting: Medical-Surgical

## 2023-07-21 ENCOUNTER — Ambulatory Visit
Admission: RE | Admit: 2023-07-21 | Discharge: 2023-07-21 | Disposition: A | Discharge status: Home / Self Care | Source: Ambulatory Visit | Attending: Nurse Practitioner | Admitting: Nurse Practitioner

## 2023-07-21 DIAGNOSIS — D0502 Lobular carcinoma in situ of left breast: Secondary | ICD-10-CM

## 2023-07-21 DIAGNOSIS — Z1239 Encounter for other screening for malignant neoplasm of breast: Secondary | ICD-10-CM | POA: Diagnosis not present

## 2023-07-21 DIAGNOSIS — Z86018 Personal history of other benign neoplasm: Secondary | ICD-10-CM | POA: Diagnosis not present

## 2023-07-21 MED ORDER — GADOPICLENOL 0.5 MMOL/ML IV SOLN
5.0000 mL | Freq: Once | INTRAVENOUS | Status: AC | PRN
  Administered 2023-07-21: 5 mL via INTRAVENOUS

## 2023-07-25 ENCOUNTER — Ambulatory Visit: Payer: Self-pay | Admitting: Nurse Practitioner

## 2023-08-01 ENCOUNTER — Other Ambulatory Visit: Payer: Self-pay | Admitting: Medical-Surgical

## 2023-08-30 ENCOUNTER — Encounter: Payer: Self-pay | Admitting: Nurse Practitioner

## 2023-09-11 ENCOUNTER — Encounter: Payer: Self-pay | Admitting: Medical-Surgical

## 2023-09-11 DIAGNOSIS — E039 Hypothyroidism, unspecified: Secondary | ICD-10-CM

## 2023-09-14 DIAGNOSIS — E039 Hypothyroidism, unspecified: Secondary | ICD-10-CM | POA: Diagnosis not present

## 2023-09-15 ENCOUNTER — Ambulatory Visit: Payer: Self-pay | Admitting: Medical-Surgical

## 2023-09-15 DIAGNOSIS — E039 Hypothyroidism, unspecified: Secondary | ICD-10-CM

## 2023-09-15 LAB — TSH: TSH: 4.71 u[IU]/mL — ABNORMAL HIGH (ref 0.450–4.500)

## 2023-10-10 ENCOUNTER — Ambulatory Visit: Admitting: Medical-Surgical

## 2023-10-10 ENCOUNTER — Encounter: Payer: Self-pay | Admitting: Medical-Surgical

## 2023-10-10 VITALS — BP 117/68 | HR 89 | Resp 20 | Ht 65.35 in | Wt 123.0 lb

## 2023-10-10 DIAGNOSIS — E039 Hypothyroidism, unspecified: Secondary | ICD-10-CM | POA: Diagnosis not present

## 2023-10-10 DIAGNOSIS — R634 Abnormal weight loss: Secondary | ICD-10-CM

## 2023-10-10 NOTE — Progress Notes (Signed)
        Established patient visit   History of Present Illness   Discussed the use of AI scribe software for clinical note transcription with the patient, who gave verbal consent to proceed.  History of Present Illness   Sheri [name] is a 53 year old female with thyroid  dysfunction who presents with concerns about weight loss and thyroid  imbalance.  Unintentional weight loss and metabolic symptoms - Significant weight loss, initially desired but now excessive - Clothes no longer fit properly due to weight loss - Elevated heart rate, with readings around 80 beats per minute even when relaxed - High energy levels but overall sense of imbalance  Blood pressure variability - Blood pressure readings variable, ranging from 93/94 over 60 to 134 systolic  Thyroid  dysfunction and medication management - Currently taking Synthroid  75 micrograms once daily - Calcium and vitamin D  supplements taken mid-morning and sometimes in the afternoon - Iron supplement taken once daily, usually at bedtime, with attention to timing to avoid interactions with other medications  Associated symptoms - No hair loss - No changes in vision - No changes in bowel function - No skin or nail changes - No palpitations - No significant trouble sleeping beyond usual pattern       Physical Exam   Physical Exam Vitals reviewed.  Constitutional:      General: She is not in acute distress.    Appearance: Normal appearance. She is not ill-appearing.  HENT:     Head: Normocephalic and atraumatic.  Cardiovascular:     Rate and Rhythm: Normal rate and regular rhythm.     Pulses: Normal pulses.     Heart sounds: Normal heart sounds. No murmur heard.    No friction rub. No gallop.  Pulmonary:     Effort: Pulmonary effort is normal. No respiratory distress.     Breath sounds: Normal breath sounds. No wheezing.  Skin:    General: Skin is warm and dry.  Neurological:     Mental Status: She is alert and  oriented to person, place, and time.  Psychiatric:        Mood and Affect: Mood normal.        Behavior: Behavior normal.        Thought Content: Thought content normal.        Judgment: Judgment normal.    Assessment & Plan   Assessment and Plan    Hypothyroidism with unintentional weight loss Possibly fluctuating thyroid  levels causing weight loss and increased heart rate. Currently on Synthroid  75 mcg daily. No symptoms of hair loss, vision changes, bowel function changes, or skin/nail changes. Blood pressure variable. Concern about weight loss despite initial goal. Managing medication schedule to avoid interactions with calcium and iron supplements. - Order TSH and free T4 to assess thyroid  function. - Order metabolic panel to check electrolytes and calcium levels. - Order A1c to evaluate glucose metabolism. - Continue Synthroid  75 mcg daily. - Adjust medication based on lab results. - Discussed medication management strategies, including timing of calcium and iron supplements to avoid interference with Synthroid  absorption.        Follow up   Return if symptoms worsen or fail to improve. __________________________________ Zada FREDRIK Palin, DNP, APRN, FNP-BC Primary Care and Sports Medicine [facility]

## 2023-10-11 ENCOUNTER — Ambulatory Visit: Payer: Self-pay | Admitting: Medical-Surgical

## 2023-10-11 LAB — CMP14+EGFR
ALT: 24 IU/L (ref 0–32)
AST: 24 IU/L (ref 0–40)
Albumin: 4.5 g/dL (ref 3.8–4.9)
Alkaline Phosphatase: 73 IU/L (ref 44–121)
BUN/Creatinine Ratio: 18 (ref 9–23)
BUN: 13 mg/dL (ref 6–24)
Bilirubin Total: 0.3 mg/dL (ref 0.0–1.2)
CO2: 21 mmol/L (ref 20–29)
Calcium: 9.1 mg/dL (ref 8.7–10.2)
Chloride: 104 mmol/L (ref 96–106)
Creatinine, Ser: 0.72 mg/dL (ref 0.57–1.00)
Globulin, Total: 2.2 g/dL (ref 1.5–4.5)
Glucose: 88 mg/dL (ref 70–99)
Potassium: 4.1 mmol/L (ref 3.5–5.2)
Sodium: 138 mmol/L (ref 134–144)
Total Protein: 6.7 g/dL (ref 6.0–8.5)
eGFR: 101 mL/min/1.73 (ref >59)

## 2023-10-11 LAB — TSH+FREE T4
Free T4: 1.5 ng/dL (ref 0.82–1.77)
TSH: 5.84 u[IU]/mL — ABNORMAL HIGH (ref 0.450–4.500)

## 2023-10-11 LAB — HEMOGLOBIN A1C
Est. average glucose Bld gHb Est-mCnc: 97 mg/dL
Hgb A1c MFr Bld: 5 % (ref 4.8–5.6)

## 2023-10-23 DIAGNOSIS — Z13820 Encounter for screening for osteoporosis: Secondary | ICD-10-CM | POA: Diagnosis not present

## 2023-10-23 DIAGNOSIS — Z01419 Encounter for gynecological examination (general) (routine) without abnormal findings: Secondary | ICD-10-CM | POA: Diagnosis not present

## 2023-10-23 DIAGNOSIS — Z6820 Body mass index (BMI) 20.0-20.9, adult: Secondary | ICD-10-CM | POA: Diagnosis not present

## 2023-10-23 DIAGNOSIS — Z124 Encounter for screening for malignant neoplasm of cervix: Secondary | ICD-10-CM | POA: Diagnosis not present

## 2023-10-23 DIAGNOSIS — N939 Abnormal uterine and vaginal bleeding, unspecified: Secondary | ICD-10-CM | POA: Diagnosis not present

## 2023-10-30 MED ORDER — SYNTHROID 75 MCG PO TABS: ORAL_TABLET | ORAL | 0 refills | Status: DC

## 2023-11-06 ENCOUNTER — Encounter: Payer: Self-pay | Admitting: Medical-Surgical

## 2023-11-06 ENCOUNTER — Ambulatory Visit (INDEPENDENT_AMBULATORY_CARE_PROVIDER_SITE_OTHER): Admitting: Medical-Surgical

## 2023-11-06 VITALS — BP 103/67 | HR 74 | Resp 20 | Ht 65.35 in | Wt 126.0 lb

## 2023-11-06 DIAGNOSIS — Z0000 Encounter for general adult medical examination without abnormal findings: Secondary | ICD-10-CM | POA: Diagnosis not present

## 2023-11-06 DIAGNOSIS — E611 Iron deficiency: Secondary | ICD-10-CM

## 2023-11-06 DIAGNOSIS — E890 Postprocedural hypothyroidism: Secondary | ICD-10-CM

## 2023-11-06 NOTE — Patient Instructions (Signed)
 Preventive Care 58-52 Years Old, Female  Preventive care refers to lifestyle choices and visits with your health care provider that can promote health and wellness. Preventive care visits are also called wellness exams.  What can I expect for my preventive care visit?  Counseling  Your health care provider may ask you questions about your:  Medical history, including:  Past medical problems.  Family medical history.  Pregnancy history.  Current health, including:  Menstrual cycle.  Method of birth control.  Emotional well-being.  Home life and relationship well-being.  Sexual activity and sexual health.  Lifestyle, including:  Alcohol, nicotine or tobacco, and drug use.  Access to firearms.  Diet, exercise, and sleep habits.  Work and work Astronomer.  Sunscreen use.  Safety issues such as seatbelt and bike helmet use.  Physical exam  Your health care provider will check your:  Height and weight. These may be used to calculate your BMI (body mass index). BMI is a measurement that tells if you are at a healthy weight.  Waist circumference. This measures the distance around your waistline. This measurement also tells if you are at a healthy weight and may help predict your risk of certain diseases, such as type 2 diabetes and high blood pressure.  Heart rate and blood pressure.  Body temperature.  Skin for abnormal spots.  What immunizations do I need?    Vaccines are usually given at various ages, according to a schedule. Your health care provider will recommend vaccines for you based on your age, medical history, and lifestyle or other factors, such as travel or where you work.  What tests do I need?  Screening  Your health care provider may recommend screening tests for certain conditions. This may include:  Lipid and cholesterol levels.  Diabetes screening. This is done by checking your blood sugar (glucose) after you have not eaten for a while (fasting).  Pelvic exam and Pap test.  Hepatitis B test.  Hepatitis C  test.  HIV (human immunodeficiency virus) test.  STI (sexually transmitted infection) testing, if you are at risk.  Lung cancer screening.  Colorectal cancer screening.  Mammogram. Talk with your health care provider about when you should start having regular mammograms. This may depend on whether you have a family history of breast cancer.  BRCA-related cancer screening. This may be done if you have a family history of breast, ovarian, tubal, or peritoneal cancers.  Bone density scan. This is done to screen for osteoporosis.  Talk with your health care provider about your test results, treatment options, and if necessary, the need for more tests.  Follow these instructions at home:  Eating and drinking    Eat a diet that includes fresh fruits and vegetables, whole grains, lean protein, and low-fat dairy products.  Take vitamin and mineral supplements as recommended by your health care provider.  Do not drink alcohol if:  Your health care provider tells you not to drink.  You are pregnant, may be pregnant, or are planning to become pregnant.  If you drink alcohol:  Limit how much you have to 0-1 drink a day.  Know how much alcohol is in your drink. In the [place] one drink equals one 12 oz bottle of beer (355 mL), one 5 oz glass of wine (148 mL), or one 1 oz glass of hard liquor (44 mL).  Lifestyle  Brush your teeth every morning and night with fluoride toothpaste. Floss one time each day.  Exercise for at least  30 minutes 5 or more days each week.  Do not use any products that contain nicotine or tobacco. These products include cigarettes, chewing tobacco, and vaping devices, such as e-cigarettes. If you need help quitting, ask your health care provider.  Do not use drugs.  If you are sexually active, practice safe sex. Use a condom or other form of protection to prevent STIs.  If you do not wish to become pregnant, use a form of birth control. If you plan to become pregnant, see your health care provider for a  prepregnancy visit.  Take aspirin only as told by your health care provider. Make sure that you understand how much to take and what form to take. Work with your health care provider to find out whether it is safe and beneficial for you to take aspirin daily.  Find healthy ways to manage stress, such as:  Meditation, yoga, or listening to music.  Journaling.  Talking to a trusted person.  Spending time with friends and family.  Minimize exposure to UV radiation to reduce your risk of skin cancer.  Safety  Always wear your seat belt while driving or riding in a vehicle.  Do not drive:  If you have been drinking alcohol. Do not ride with someone who has been drinking.  When you are tired or distracted.  While texting.  If you have been using any mind-altering substances or drugs.  Wear a helmet and other protective equipment during sports activities.  If you have firearms in your house, make sure you follow all gun safety procedures.  Seek help if you have been physically or sexually abused.  What's next?  Visit your health care provider once a year for an annual wellness visit.  Ask your health care provider how often you should have your eyes and teeth checked.  Stay up to date on all vaccines.  This information is not intended to replace advice given to you by your health care provider. Make sure you discuss any questions you have with your health care provider.  Document Revised: 07/15/2020 Document Reviewed: 07/15/2020  Elsevier Patient Education  2024 [employer].

## 2023-11-06 NOTE — Progress Notes (Signed)
 Complete physical exam  Patient: Sheri [name]   DOB: Feb 05, 1970   53 y.o. Female  MRN: [id number]  Subjective:    Chief Complaint  Patient presents with   Annual Exam   Sheri [name] is a 53 y.o. female who presents today for a complete physical exam. She reports consuming a general diet. Walking pad while at work, hiking, and strength training for exercise. She generally feels well. She reports sleeping fairly well. She does not have additional problems to discuss today.    Most recent fall risk assessment:    05/03/2022    2:01 PM  Fall Risk   Falls in the past year? 0  Number falls in past yr: 0  Injury with Fall? 0  Risk for fall due to : No Fall Risks  Follow up Falls evaluation completed     Most recent depression screenings:    10/10/2023   11:26 AM 05/03/2022    2:01 PM  PHQ 2/9 Scores  PHQ - 2 Score 0 0    Vision:Within last year and Dental: No current dental problems and Receives regular dental care    Patient Care Team: Willo Mini, NP as PCP - General (Nurse Practitioner) Vernetta Berg, MD as Consulting Physician (General Surgery) Lanny Callander, MD as Consulting Physician (Hematology)   Outpatient Medications Prior to Visit  Medication Sig   calcium carbonate (CALCIUM 600) 600 MG TABS tablet Take 600 mg by mouth 2 (two) times daily with a meal.   Cholecalciferol (VITAMIN D ) 50 MCG (2000 UT) CAPS Take by mouth.   EPINEPHrine  (EPIPEN  2-PAK) 0.3 mg/0.3 mL IJ SOAJ injection Inject 0.3 mg into the muscle as needed for anaphylaxis.   ferrous sulfate 325 (65 FE) MG tablet Take 325 mg by mouth at bedtime.   fluticasone  (FLONASE ) 50 MCG/ACT nasal spray SPRAY 2 SPRAYS INTO EACH NOSTRIL EVERY DAY   levocetirizine (XYZAL ) 5 MG tablet TAKE 1 TABLET BY MOUTH EVERY DAY IN THE EVENING   montelukast  (SINGULAIR ) 10 MG tablet Take 10 mg by mouth at bedtime.   Polyethyl Glycol-Propyl Glycol (SYSTANE) 0.4-0.3 % SOLN Apply to eye.   RESTASIS 0.05 % ophthalmic  emulsion 1 drop 2 (two) times daily.   SYNTHROID  75 MCG tablet Take 1 tablet daily for 5 days per week then take 1/2 tablet daily on days 6 and 7.   No facility-administered medications prior to visit.    Review of Systems  Constitutional:  Negative for chills, fever, malaise/fatigue and weight loss.  HENT:  Negative for congestion, ear pain, hearing loss, sinus pain and sore throat.   Eyes:  Negative for blurred vision, photophobia and pain.  Respiratory:  Negative for cough, shortness of breath and wheezing.   Cardiovascular:  Negative for chest pain, palpitations and leg swelling.  Gastrointestinal:  Negative for abdominal pain, constipation, diarrhea, heartburn, nausea and vomiting.  Genitourinary:  Negative for dysuria, frequency and urgency.  Musculoskeletal:  Negative for falls and neck pain.  Skin:  Negative for itching and rash.  Neurological:  Negative for dizziness, weakness and headaches.  Endo/Heme/Allergies:  Negative for polydipsia. Does not bruise/bleed easily.  Psychiatric/Behavioral:  Negative for depression, substance abuse and suicidal ideas. The patient is not nervous/anxious and does not have insomnia.      Objective:     BP 103/67 (BP Location: Left Arm, Cuff Size: Small)   Pulse 74   Resp 20   Ht 5' 5.35 (1.66 m)   Wt 126 lb 0.6 oz (57.2  kg)   PF 96 L/min   BMI 20.75 kg/m    Physical Exam Vitals reviewed.  Constitutional:      General: She is not in acute distress.    Appearance: Normal appearance. She is not ill-appearing.  HENT:     Head: Normocephalic and atraumatic.     Right Ear: Tympanic membrane, ear canal and external ear normal. There is no impacted cerumen.     Left Ear: Tympanic membrane, ear canal and external ear normal. There is no impacted cerumen.     Nose: Nose normal. No congestion or rhinorrhea.     Mouth/Throat:     Mouth: Mucous membranes are moist.     Pharynx: No oropharyngeal exudate or posterior oropharyngeal erythema.   Eyes:     General: No scleral icterus.       Right eye: No discharge.        Left eye: No discharge.     Extraocular Movements: Extraocular movements intact.     Conjunctiva/sclera: Conjunctivae normal.     Pupils: Pupils are equal, round, and reactive to light.  Neck:     Thyroid : No thyromegaly.     Vascular: No carotid bruit or JVD.     Trachea: Trachea normal.  Cardiovascular:     Rate and Rhythm: Normal rate and regular rhythm.     Pulses: Normal pulses.     Heart sounds: Normal heart sounds. No murmur heard.    No friction rub. No gallop.  Pulmonary:     Effort: Pulmonary effort is normal. No respiratory distress.     Breath sounds: Normal breath sounds. No wheezing.  Abdominal:     General: Bowel sounds are normal. There is no distension.     Palpations: Abdomen is soft.     Tenderness: There is no abdominal tenderness. There is no guarding.  Musculoskeletal:        General: Normal range of motion.     Cervical back: Normal range of motion and neck supple.  Lymphadenopathy:     Cervical: No cervical adenopathy.  Skin:    General: Skin is warm and dry.  Neurological:     Mental Status: She is alert and oriented to person, place, and time.     Cranial Nerves: No cranial nerve deficit.  Psychiatric:        Mood and Affect: Mood normal.        Behavior: Behavior normal.        Thought Content: Thought content normal.        Judgment: Judgment normal.      No results found for any visits on 11/06/23.     Assessment & Plan:    Routine Health Maintenance and Physical Exam  Immunization History  Administered Date(s) Administered   Influenza Split 03/20/2012   Influenza,inj,Quad PF,6+ Mos 10/31/2012, 11/05/2016   Influenza-Unspecified 11/09/2017, 11/08/2018, 11/01/2019, 11/11/2020, 10/29/2022, 11/02/2023   PFIZER(Purple Top)SARS-COV-2 Vaccination 10/22/2020   Td 07/02/2002   Tdap 05/26/2016   Unspecified SARS-COV-2 Vaccination 09/24/2022   Varicella 10/25/2022    Zoster Recombinant(Shingrix) 04/23/2021, 08/05/2021    Health Maintenance  Topic Date Due   Hepatitis B Vaccines 19-59 Average Risk (1 of 3 - 19+ 3-dose series) Never done   Pneumococcal Vaccine: 50+ Years (1 of 1 - PCV) Never done   Cervical Cancer Screening (HPV/Pap Cotest)  09/08/2023   COVID-19 Vaccine (3 - Mixed Product risk series) 11/22/2023 (Originally 10/22/2022)   Mammogram  07/20/2024   DTaP/Tdap/Td (3 - Td or  Tdap) 05/27/2026   Colonoscopy  05/21/2029   Influenza Vaccine  Completed   Zoster Vaccines- Shingrix  Completed   HPV VACCINES  Aged Out   Meningococcal B Vaccine  Aged Out   Hepatitis C Screening  Discontinued   HIV Screening  Discontinued    Discussed health benefits of physical activity, and encouraged her to engage in regular exercise appropriate for her age and condition.  1. Annual physical exam (Primary) Checking labs as below. UTD on preventative care. Wellness information provided with AVS. - CBC - Lipid panel  2. Postablative hypothyroidism Currently, on levothyroxine  75mcg daily for 5 days and 1/2 tablet daily on the remaining 2 days. Will return for recheck of the TSH level in 6 weeks.   3. Iron deficiency Rechecking iron panel. - Iron, TIBC and Ferritin Panel  Return in about 1 year (around 11/05/2024) for annual physical exam.   Zada Palin, NP

## 2023-11-07 ENCOUNTER — Ambulatory Visit: Payer: Self-pay | Admitting: Medical-Surgical

## 2023-11-07 LAB — IRON,TIBC AND FERRITIN PANEL
Ferritin: 84 ng/mL (ref 15–150)
Iron Saturation: 55 % (ref 15–55)
Iron: 145 ug/dL (ref 27–159)
Total Iron Binding Capacity: 265 ug/dL (ref 250–450)
UIBC: 120 ug/dL — ABNORMAL LOW (ref 131–425)

## 2023-11-07 LAB — CBC
Hematocrit: 42.3 % (ref 34.0–46.6)
Hemoglobin: 13.7 g/dL (ref 11.1–15.9)
MCH: 30.9 pg (ref 26.6–33.0)
MCHC: 32.4 g/dL (ref 31.5–35.7)
MCV: 95 fL (ref 79–97)
Platelets: 204 x10E3/uL (ref 150–450)
RBC: 4.44 x10E6/uL (ref 3.77–5.28)
RDW: 12.7 % (ref 11.7–15.4)
WBC: 4.4 x10E3/uL (ref 3.4–10.8)

## 2023-11-07 LAB — LIPID PANEL
Chol/HDL Ratio: 1.9 ratio (ref 0.0–4.4)
Cholesterol, Total: 200 mg/dL — ABNORMAL HIGH (ref 100–199)
HDL: 104 mg/dL (ref >39)
LDL Chol Calc (NIH): 84 mg/dL (ref 0–99)
Triglycerides: 63 mg/dL (ref 0–149)
VLDL Cholesterol Cal: 12 mg/dL (ref 5–40)

## 2023-11-20 ENCOUNTER — Other Ambulatory Visit: Payer: Self-pay | Admitting: Obstetrics and Gynecology

## 2023-11-20 DIAGNOSIS — Z1231 Encounter for screening mammogram for malignant neoplasm of breast: Secondary | ICD-10-CM

## 2023-11-22 DIAGNOSIS — E039 Hypothyroidism, unspecified: Secondary | ICD-10-CM | POA: Diagnosis not present

## 2023-11-23 ENCOUNTER — Encounter: Payer: Self-pay | Admitting: Medical-Surgical

## 2023-11-23 DIAGNOSIS — E890 Postprocedural hypothyroidism: Secondary | ICD-10-CM

## 2023-11-23 LAB — TSH+FREE T4
Free T4: 1.17 ng/dL (ref 0.82–1.77)
TSH: 9.16 u[IU]/mL — ABNORMAL HIGH (ref 0.450–4.500)

## 2023-11-23 MED ORDER — SYNTHROID 75 MCG PO TABS: ORAL_TABLET | ORAL | 1 refills | Status: AC

## 2023-11-23 NOTE — Telephone Encounter (Signed)
 Pended Synthroid  prescription  Last written 09/29/025 as Trina #[id number] Last OV 11/06/2023 Upcoming appt = none

## 2023-11-23 NOTE — Addendum Note (Signed)
 Addended by: Omarie Parcell P on: 11/23/2023 01:55 PM   Modules accepted: Orders

## 2023-12-01 DIAGNOSIS — E890 Postprocedural hypothyroidism: Secondary | ICD-10-CM | POA: Diagnosis not present

## 2023-12-03 ENCOUNTER — Other Ambulatory Visit: Payer: Self-pay | Admitting: Medical-Surgical

## 2023-12-12 DIAGNOSIS — D485 Neoplasm of uncertain behavior of skin: Secondary | ICD-10-CM | POA: Diagnosis not present

## 2023-12-12 DIAGNOSIS — L720 Epidermal cyst: Secondary | ICD-10-CM | POA: Diagnosis not present

## 2023-12-26 DIAGNOSIS — L720 Epidermal cyst: Secondary | ICD-10-CM | POA: Diagnosis not present

## 2024-01-03 ENCOUNTER — Ambulatory Visit
Admission: RE | Admit: 2024-01-03 | Discharge: 2024-01-03 | Disposition: A | Source: Ambulatory Visit | Attending: Obstetrics and Gynecology | Admitting: Obstetrics and Gynecology

## 2024-01-03 DIAGNOSIS — Z1231 Encounter for screening mammogram for malignant neoplasm of breast: Secondary | ICD-10-CM | POA: Diagnosis not present

## 2024-01-15 DIAGNOSIS — E890 Postprocedural hypothyroidism: Secondary | ICD-10-CM | POA: Diagnosis not present

## 2024-02-12 ENCOUNTER — Encounter: Payer: Self-pay | Admitting: Medical-Surgical

## 2024-02-12 DIAGNOSIS — E611 Iron deficiency: Secondary | ICD-10-CM

## 2024-02-16 ENCOUNTER — Ambulatory Visit: Payer: Self-pay | Admitting: Medical-Surgical

## 2024-02-16 LAB — IRON,TIBC AND FERRITIN PANEL
Ferritin: 103 ng/mL (ref 15–150)
Iron Saturation: 21 % (ref 15–55)
Iron: 58 ug/dL (ref 27–159)
Total Iron Binding Capacity: 278 ug/dL (ref 250–450)
UIBC: 220 ug/dL (ref 131–425)

## 2024-03-07 ENCOUNTER — Other Ambulatory Visit: Payer: Self-pay | Admitting: Medical-Surgical
